# Patient Record
Sex: Male | Born: 1995 | Race: White | Hispanic: No | Marital: Married | Smoking: Current some day smoker
Health system: Southern US, Community
[De-identification: ages and names within clinical notes are randomized; demographics above are authoritative.]

## PROBLEM LIST (undated history)

## (undated) DIAGNOSIS — R079 Chest pain, unspecified: Secondary | ICD-10-CM

## (undated) DIAGNOSIS — R011 Cardiac murmur, unspecified: Secondary | ICD-10-CM

## (undated) DIAGNOSIS — I10 Essential (primary) hypertension: Secondary | ICD-10-CM

## (undated) HISTORY — DX: Essential (primary) hypertension: I10

## (undated) HISTORY — DX: Chest pain, unspecified: R07.9

---
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
## 2016-05-28 ENCOUNTER — Emergency Department (HOSPITAL_COMMUNITY)
Admission: EM | Admit: 2016-05-28 | Discharge: 2016-05-28 | Disposition: A | Discharge status: Home / Self Care | Payer: Medicaid Other | Attending: Emergency Medicine | Admitting: Emergency Medicine

## 2016-05-28 ENCOUNTER — Emergency Department (HOSPITAL_COMMUNITY): Payer: Medicaid Other

## 2016-05-28 ENCOUNTER — Encounter (HOSPITAL_COMMUNITY): Payer: Self-pay | Admitting: Emergency Medicine

## 2016-05-28 DIAGNOSIS — Y9389 Activity, other specified: Secondary | ICD-10-CM | POA: Diagnosis not present

## 2016-05-28 DIAGNOSIS — S60221A Contusion of right hand, initial encounter: Secondary | ICD-10-CM

## 2016-05-28 DIAGNOSIS — S60511A Abrasion of right hand, initial encounter: Secondary | ICD-10-CM

## 2016-05-28 DIAGNOSIS — Y929 Unspecified place or not applicable: Secondary | ICD-10-CM | POA: Insufficient documentation

## 2016-05-28 DIAGNOSIS — S6991XA Unspecified injury of right wrist, hand and finger(s), initial encounter: Secondary | ICD-10-CM | POA: Diagnosis present

## 2016-05-28 DIAGNOSIS — W228XXA Striking against or struck by other objects, initial encounter: Secondary | ICD-10-CM | POA: Insufficient documentation

## 2016-05-28 DIAGNOSIS — Y999 Unspecified external cause status: Secondary | ICD-10-CM | POA: Diagnosis not present
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
## 2016-05-28 NOTE — Discharge Instructions (Signed)
Your x-rays are negative for fracture or dislocation. Please apply ice for swelling. Please cleanse the wounds daily with soap and water, and apply fresh bandage until healed. Use Tylenol, and/or ibuprofen for soreness. Please see Dr. Donnie Coffinubin, or return to the emergency department if any changes, problems, or concerns.
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
## 2018-01-21 ENCOUNTER — Encounter (HOSPITAL_COMMUNITY): Payer: Self-pay | Admitting: Emergency Medicine

## 2018-01-21 ENCOUNTER — Emergency Department (HOSPITAL_COMMUNITY): Payer: BC Managed Care – PPO

## 2018-01-21 ENCOUNTER — Emergency Department (HOSPITAL_COMMUNITY)
Admission: EM | Admit: 2018-01-21 | Discharge: 2018-01-21 | Disposition: A | Discharge status: Home / Self Care | Payer: BC Managed Care – PPO | Attending: Emergency Medicine | Admitting: Emergency Medicine

## 2018-01-21 ENCOUNTER — Other Ambulatory Visit: Payer: Self-pay

## 2018-01-21 DIAGNOSIS — F17200 Nicotine dependence, unspecified, uncomplicated: Secondary | ICD-10-CM | POA: Diagnosis not present

## 2018-01-21 DIAGNOSIS — R079 Chest pain, unspecified: Secondary | ICD-10-CM

## 2018-01-21 HISTORY — DX: Cardiac murmur, unspecified: R01.1

## 2018-01-21 LAB — CBC
HEMATOCRIT: 48.2 % (ref 39.0–52.0)
HEMOGLOBIN: 17.2 g/dL — AB (ref 13.0–17.0)
MCH: 31.7 pg (ref 26.0–34.0)
MCHC: 35.7 g/dL (ref 30.0–36.0)
MCV: 88.8 fL (ref 78.0–100.0)
Platelets: 252 10*3/uL (ref 150–400)
RBC: 5.43 MIL/uL (ref 4.22–5.81)
RDW: 12.3 % (ref 11.5–15.5)
WBC: 8 10*3/uL (ref 4.0–10.5)

## 2018-01-21 LAB — BASIC METABOLIC PANEL
ANION GAP: 10 (ref 5–15)
BUN: 15 mg/dL (ref 6–20)
CALCIUM: 9.7 mg/dL (ref 8.9–10.3)
CHLORIDE: 105 mmol/L (ref 98–111)
CO2: 24 mmol/L (ref 22–32)
Creatinine, Ser: 0.87 mg/dL (ref 0.61–1.24)
GFR calc non Af Amer: >60 mL/min (ref >60)
GLUCOSE: 108 mg/dL — AB (ref 70–99)
Potassium: 3.7 mmol/L (ref 3.5–5.1)
Sodium: 139 mmol/L (ref 135–145)

## 2018-01-21 LAB — I-STAT TROPONIN, ED
TROPONIN I, POC: 0 ng/mL (ref 0.00–0.08)
Troponin i, poc: 0 ng/mL (ref 0.00–0.08)

## 2018-01-21 MED ORDER — ONDANSETRON HCL 4 MG/2ML IJ SOLN
4.0000 mg | Freq: Once | INTRAMUSCULAR | Status: AC
  Administered 2018-01-21: 4 mg via INTRAVENOUS
  Filled 2018-01-21: qty 2

## 2018-01-21 MED ORDER — SODIUM CHLORIDE 0.9 % IV BOLUS
1000.0000 mL | Freq: Once | INTRAVENOUS | Status: AC
  Administered 2018-01-21: 1000 mL via INTRAVENOUS

## 2018-01-21 NOTE — Discharge Instructions (Addendum)
If you develop recurrent or worsening chest pain, shortness of breath, vomiting, sweating, dizziness, or any other new ER for evaluation.  Otherwise follow closely with your primary care physician.

## 2018-01-21 NOTE — ED Notes (Signed)
Patient has no arm band.

## 2018-01-21 NOTE — ED Triage Notes (Signed)
Patient c/o intermittent chest pain x2 months. Per patient worse pain today then previous. Patient states dizziness. Only cardiac hx is heart murmur. Family hx of early MI.

## 2018-01-21 NOTE — ED Provider Notes (Signed)
[facility] EMERGENCY DEPARTMENT Provider Note   CSN: [id number] Arrival date & time: 01/21/18  1601     History   Chief Complaint Chief Complaint  Patient presents with  . Chest Pain    HPI Paul Lee is a 22 y.o. male.  HPI  22 year old male no significant past medical history who occasionally smokes presents with chest pain.  Started about 30 minutes prior to arrival.  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  Lasts about 10 minutes and resolved on its own.  He broke out in a sweat and felt like he had to breathe deeply although he was not dyspneic.  He did have some left hand numbness and dizziness.  Went away and then came back briefly while on the way here but then resolved again.  Currently feels a little nauseated but no other complaints.  Grandfather had a heart attack in his 55s he thinks.  Otherwise patient denies any significant past medical history.  The patient states he is been having on and off chest pressure similar to this over the last 2 months.  Occasionally will happen once a week or once every couple weeks, sometimes a couple times a week.  It is never produced by anything specific such as exertion and sometimes will happen when driving a car. No recent travel or surgeries. No leg swelling. Took 3 baby aspirin prior to arrival.  Past Medical History:  Diagnosis Date  . Heart murmur     There are no active problems to display for this patient.   History reviewed. No pertinent surgical history.      Home Medications    Prior to Admission medications   Medication Sig Start Date End Date Taking? Authorizing Provider  aspirin EC 81 MG tablet Take 81 mg by mouth once as needed (chest pain).   Yes [provider]  ibuprofen (ADVIL,MOTRIN) 200 MG tablet Take 400 mg by mouth every 6 (six) hours as needed for headache.   Yes [provider]    Family  History No family history on file.  Social History Social History   Tobacco Use  . Smoking status: Current Some Day Smoker  . Smokeless tobacco: Never Used  Substance Use Topics  . Alcohol use: No  . Drug use: No     Allergies   Patient has no known allergies.   Review of Systems Review of Systems  Constitutional: Positive for diaphoresis.  Respiratory: Negative for shortness of breath.   Cardiovascular: Positive for chest pain.  Gastrointestinal: Positive for nausea. Negative for vomiting.  Neurological: Positive for dizziness and numbness.  All other systems reviewed and are negative.    Physical Exam Updated Vital Signs BP (!) 143/86   Pulse (!) 57   Temp 98 F (36.7 C) (Oral)   Resp (!) 22   Ht 5\' 11"  (1.803 m)   Wt 117.9 kg   SpO2 99%   BMI 36.26 kg/m   Physical Exam  Constitutional: He is oriented to person, place, and time. He appears well-developed and well-nourished.  Non-toxic appearance. He does not appear ill.  HENT:  Head: Normocephalic and atraumatic.  Right Ear: External ear normal.  Left Ear: External ear normal.  Nose: Nose normal.  Eyes: Right eye exhibits no discharge. Left eye exhibits no discharge.  Neck: Neck supple.  Cardiovascular: Normal rate, regular rhythm and normal heart  sounds.  Pulses:      Radial pulses are 2+ on the right side, and 2+ on the left side.  Pulmonary/Chest: Effort normal and breath sounds normal.  Abdominal: Soft. There is no tenderness.  Musculoskeletal: He exhibits no edema.       Right lower leg: He exhibits no edema.       Left lower leg: He exhibits no edema.  Neurological: He is alert and oriented to person, place, and time.  Skin: Skin is warm and dry.  Nursing note and vitals reviewed.    ED Treatments / Results  Labs (all labs ordered are listed, but only abnormal results are displayed) Labs Reviewed  BASIC METABOLIC PANEL - Abnormal; Notable for the following components:      Result Value    Glucose, Bld 108 (*)    All other components within normal limits  CBC - Abnormal; Notable for the following components:   Hemoglobin 17.2 (*)    All other components within normal limits  I-STAT TROPONIN, ED  I-STAT TROPONIN, ED    EKG EKG Interpretation  Date/Time:  Sunday January 21 2018 16:09:10 EDT Ventricular Rate:  66 PR Interval:    QRS Duration: 84 QT Interval:  407 QTC Calculation: 427 R Axis:   72 Text Interpretation:  Sinus arrhythmia no acute ST/T changes No old tracing to compare Confirmed by Pricilla Loveless [phone]) on 01/21/2018 4:15:44 PM   EKG Interpretation  Date/Time:  Sunday January 21 2018 17:14:14 EDT Ventricular Rate:  56 PR Interval:    QRS Duration: 85 QT Interval:  451 QTC Calculation: 436 R Axis:   75 Text Interpretation:  Sinus rhythm ST elev, probable normal early repol pattern no significant change since earlier in the day Confirmed by Pricilla Loveless [phone]) on 01/21/2018 5:58:30 PM        Radiology Dg Chest 2 View  Result Date: 01/21/2018 CLINICAL DATA:  Chest pain and pressure.  Left arm tingling. EXAM: CHEST - 2 VIEW COMPARISON:  Single-view of the chest 06/30/2012. FINDINGS: The lungs are clear. Heart size is normal. No pneumothorax or pleural fluid. No acute or focal bony abnormality. IMPRESSION: Negative chest. Electronically Signed   By: Drusilla Kanner M.D.   On: 01/21/2018 17:19    Procedures Procedures (including critical care time)  Medications Ordered in ED Medications  sodium chloride 0.9 % bolus 1,000 mL (0 mLs Intravenous Stopped 01/21/18 1754)  ondansetron (ZOFRAN) injection 4 mg (4 mg Intravenous Given 01/21/18 1652)     Initial Impression / Assessment and Plan / ED Course  I have reviewed the triage vital signs and the nursing notes.  Pertinent labs & imaging results that were available during my care of the patient were reviewed by me and considered in my medical decision making (see chart for details).      Patient's chest pain is unlikely to be ACS given his age.  While he does occasionally smoke and has a family history of early coronary disease, this was not until the 3s and he has no other concerning findings related to possible ACS.  I doubt PE or dissection.  Unclear if it was the active working on the car that caused the symptoms but is been having random episodes of chest pain throughout the past weeks.  He was initially quite hypertensive although this may been anxiety related to being in the ED and feeling poorly because now his blood pressure is coming back down to a more normal range.  We have  discussed low concern for ACS, the 2 negative troponins, benign ECGs and importance of follow-up.  Strict return precautions.  Final Clinical Impressions(s) / ED Diagnoses   Final diagnoses:  Nonspecific chest pain    ED Discharge Orders    None       Pricilla Loveless, MD 01/21/18 2205

## 2018-02-01 ENCOUNTER — Ambulatory Visit: Payer: BC Managed Care – PPO | Admitting: Physician Assistant

## 2018-02-01 ENCOUNTER — Encounter: Payer: Self-pay | Admitting: Family Medicine

## 2018-02-01 ENCOUNTER — Other Ambulatory Visit: Payer: Self-pay

## 2018-02-01 ENCOUNTER — Ambulatory Visit (INDEPENDENT_AMBULATORY_CARE_PROVIDER_SITE_OTHER): Payer: BC Managed Care – PPO | Admitting: Family Medicine

## 2018-02-01 VITALS — BP 138/72 | HR 48 | Temp 98.5°F | Resp 14 | Ht 71.26 in | Wt 246.5 lb

## 2018-02-01 DIAGNOSIS — Z7689 Persons encountering health services in other specified circumstances: Secondary | ICD-10-CM

## 2018-02-01 DIAGNOSIS — R030 Elevated blood-pressure reading, without diagnosis of hypertension: Secondary | ICD-10-CM

## 2018-02-01 DIAGNOSIS — I160 Hypertensive urgency: Secondary | ICD-10-CM | POA: Diagnosis not present

## 2018-02-01 LAB — URINALYSIS, ROUTINE W REFLEX MICROSCOPIC
Bilirubin Urine: NEGATIVE
GLUCOSE, UA: NEGATIVE
HGB URINE DIPSTICK: NEGATIVE
Ketones, ur: NEGATIVE
LEUKOCYTES UA: NEGATIVE
NITRITE: NEGATIVE
PH: 7.5 (ref 5.0–8.0)
Protein, ur: NEGATIVE
SPECIFIC GRAVITY, URINE: 1.02 (ref 1.001–1.03)

## 2018-02-01 MED ORDER — LISINOPRIL 20 MG PO TABS: 20.0000 mg | ORAL_TABLET | Freq: Every day | ORAL | 3 refills | Status: DC

## 2018-02-01 MED ORDER — LISINOPRIL 10 MG PO TABS: 10.0000 mg | ORAL_TABLET | Freq: Every day | ORAL | 3 refills | Status: DC

## 2018-02-01 NOTE — Patient Instructions (Addendum)
If you have any chest pain   Preventing Hypertension Hypertension, commonly called high blood pressure, is when the force of blood pumping through the arteries is too strong. Arteries are blood vessels that carry blood from the heart throughout the body. Over time, hypertension can damage the arteries and decrease blood flow to important parts of the body, including the brain, heart, and kidneys. Often, hypertension does not cause symptoms until blood pressure is very high. For this reason, it is important to have your blood pressure checked on a regular basis. Hypertension can often be prevented with diet and lifestyle changes. If you already have hypertension, you can control it with diet and lifestyle changes, as well as medicine. What nutrition changes can be made? Maintain a healthy diet. This includes:  Eating less salt (sodium). Ask your health care provider how much sodium is safe for you to have. The general recommendation is to consume less than 1 tsp (2,300 mg) of sodium a day. ? Do not add salt to your food. ? Choose low-sodium options when grocery shopping and eating out.  Limiting fats in your diet. You can do this by eating low-fat or fat-free dairy products and by eating less red meat.  Eating more fruits, vegetables, and whole grains. Make a goal to eat: ? 1-2 cups of fresh fruits and vegetables each day. ? 3-4 servings of whole grains each day.  Avoiding foods and beverages that have added sugars.  Eating fish that contain healthy fats (omega-3 fatty acids), such as mackerel or salmon.  If you need help putting together a healthy eating plan, try the DASH diet. This diet is high in fruits, vegetables, and whole grains. It is low in sodium, red meat, and added sugars. DASH stands for Dietary Approaches to Stop Hypertension. What lifestyle changes can be made?  Lose weight if you are overweight. Losing just 3?5% of your body weight can help prevent or control  hypertension. ? For example, if your present weight is 200 lb (91 kg), a loss of 3-5% of your weight means losing 6-10 lb (2.7-4.5 kg). ? Ask your health care provider to help you with a diet and exercise plan to safely lose weight.  Get enough exercise. Do at least 150 minutes of moderate-intensity exercise each week. ? You could do this in short exercise sessions several times a day, or you could do longer exercise sessions a few times a week. For example, you could take a brisk 10-minute walk or bike ride, 3 times a day, for 5 days a week.  Find ways to reduce stress, such as exercising, meditating, listening to music, or taking a yoga class. If you need help reducing stress, ask your health care provider.  Do not smoke. This includes e-cigarettes. Chemicals in tobacco and nicotine products raise your blood pressure each time you smoke. If you need help quitting, ask your health care provider.  Avoid alcohol. If you drink alcohol, limit alcohol intake to no more than 1 drink a day for nonpregnant women and 2 drinks a day for men. One drink equals 12 oz of beer, 5 oz of wine, or 1 oz of hard liquor. Why are these changes important? Diet and lifestyle changes can help you prevent hypertension, and they may make you feel better overall and improve your quality of life. If you have hypertension, making these changes will help you control it and help prevent major complications, such as:  Hardening and narrowing of arteries that supply blood to: ?  Your heart. This can cause a heart attack. ? Your brain. This can cause a stroke. ? Your kidneys. This can cause kidney failure.  Stress on your heart muscle, which can cause heart failure.  What can I do to lower my risk?  Work with your health care provider to make a hypertension prevention plan that works for you. Follow your plan and keep all follow-up visits as told by your health care provider.  Learn how to check your blood pressure at home.  Make sure that you know your personal target blood pressure, as told by your health care provider. How is this treated? In addition to diet and lifestyle changes, your health care provider may recommend medicines to help lower your blood pressure. You may need to try a few different medicines to find what works best for you. You also may need to take more than one medicine. Take over-the-counter and prescription medicines only as told by your health care provider. Where to find support: Your health care provider can help you prevent hypertension and help you keep your blood pressure at a healthy level. Your local hospital or your community may also provide support services and prevention programs. The American Heart Association offers an online support network at: https://www.lee.net/ Where to find more information: Learn more about hypertension from:  National Heart, Lung, and Blood Institute: https://www.peterson.org/  Centers for Disease Control and Prevention: AboutHD.co.nz  American Academy of Family Physicians: http://familydoctor.org/familydoctor/en/diseases-conditions/high-blood-pressure.printerview.all.html  Learn more about the DASH diet from:  National Heart, Lung, and Blood Institute: WedMap.it  Contact a health care provider if:  You think you are having a reaction to medicines you have taken.  You have recurrent headaches or feel dizzy.  You have swelling in your ankles.  You have trouble with your vision. Summary  Hypertension often does not cause any symptoms until blood pressure is very high. It is important to get your blood pressure checked regularly.  Diet and lifestyle changes are the most important steps in preventing hypertension.  By keeping your blood pressure in a healthy range, you can prevent complications like heart attack, heart failure, stroke,  and kidney failure.  Work with your health care provider to make a hypertension prevention plan that works for you. This information is not intended to replace advice given to you by your health care provider. Make sure you discuss any questions you have with your health care provider. Document Released: 04/19/2015 Document Revised: 12/14/2015 Document Reviewed: 12/14/2015 Elsevier Interactive Patient Education  [employer].

## 2018-02-01 NOTE — Progress Notes (Signed)
Patient ID: Paul Lee, male    DOB: 1995-08-21, 22 y.o.   MRN: [id number]  PCP: Danelle Berry, PA-C  Chief Complaint  Patient presents with  . New Patient    is not fasting    Subjective:   Paul Lee is a 22 y.o. male, presents to clinic to establish care and to get a complete physical however he does have very elevated blood pressure today, and he recently went to the ER for chest pain and elevated BP.   Patient notes that for a few weeks he had intermittent chest pressure like someone was sitting on his chest, when he presented to the ER on 01/21/18 he was diaphoretic with chest pain, nausea, dizziness numbness blood pressure was elevated to SBP 170's.  Today's blood pressure was systolic 180-190.  He does not feel anxious, he denies CP, HA, vision disturbances.  He reports that he has had no headaches, shortness of breath or chest pain for the past 2 weeks after the ER visit.  He reports that they said that he was dehydrated and he admits to drinking roughly 7-9 sodas and hardly ever drinking water.  Blood work and ER documentation from 01/21/2018 was reviewed he had normal electrolytes, normal kidney function and negative troponin x2, hemoglobin was elevated, suspect they were suspicious for hemoconcentration secondary to dehydration, chest x-ray and EKG were unremarkable.  Patient's blood pressure came down on its own while in the ER without any medications to treat  He is a current smoker, trying to quit. Father and Mother have med hx of HTN.  No hx of early MI/stroke.  He has no current or past medical history except for heart murmur he believes when he was a small child he does not recall ever going to cardiology for this.  Last PCP was Dr. Donnie Coffin - pediatrician.    There are no active problems to display for this patient.    Prior to Admission medications   Medication Sig Start Date End Date Taking? Authorizing Provider  aspirin EC 81 MG tablet Take 81 mg by  mouth once as needed (chest pain).    [provider]  ibuprofen (ADVIL,MOTRIN) 200 MG tablet Take 400 mg by mouth every 6 (six) hours as needed for headache.    [provider]     No Known Allergies   Family History  Problem Relation Age of Onset  . Depression Mother   . Hypertension Mother   . Hypertension Father   . Hypertension Maternal Grandmother   . Hyperlipidemia Maternal Grandmother   . Alcohol abuse Maternal Grandfather   . Cancer Maternal Grandfather        skin  . Heart disease Maternal Grandfather        2003  . Hypertension Maternal Grandfather   . Hypertension Paternal Grandmother   . Cancer Paternal Grandfather        leukemia  . Hypertension Paternal Grandfather   . Stroke Paternal Grandfather      Social History   Socioeconomic History  . Marital status: Single    Spouse name: Not on file  . Number of children: Not on file  . Years of education: Not on file  . Highest education level: Associate degree: occupational, Scientist, product/process development, or vocational program  Occupational History  . Occupation: [employer]  Social Needs  . Financial resource strain: Not hard at all  . Food insecurity:    Worry: Never true    Inability: Never true  . Transportation  needs:    Medical: No    Non-medical: No  Tobacco Use  . Smoking status: Current Some Day Smoker  . Smokeless tobacco: Former Neurosurgeon  . Tobacco comment: in the process of quitting  Substance and Sexual Activity  . Alcohol use: Yes    Alcohol/week: 2.0 - 3.0 standard drinks    Types: 2 - 3 Cans of beer per week  . Drug use: No  . Sexual activity: Yes    Birth control/protection: Condom  Lifestyle  . Physical activity:    Days per week: 5 days    Minutes per session: 150+ min  . Stress: Not at all  Relationships  . Social connections:    Talks on phone: More than three times a week    Gets together: More than three times a week    Attends religious service: More than 4 times per year     Active member of club or organization: Yes    Attends meetings of clubs or organizations: More than 4 times per year    Relationship status: Living with partner  . Intimate partner violence:    Fear of current or ex partner: No    Emotionally abused: No    Physically abused: No    Forced sexual activity: No  Other Topics Concern  . Not on file  Social History Narrative  . Not on file     Review of Systems  Constitutional: Negative.  Negative for activity change, appetite change, fatigue and unexpected weight change.  HENT: Negative.   Eyes: Negative.   Respiratory: Negative.  Negative for shortness of breath.   Cardiovascular: Negative.  Negative for chest pain, palpitations and leg swelling.  Gastrointestinal: Negative.  Negative for abdominal pain and blood in stool.  Endocrine: Negative.   Genitourinary: Negative.  Negative for decreased urine volume, difficulty urinating, testicular pain and urgency.  Skin: Negative.  Negative for color change and pallor.  Allergic/Immunologic: Negative.   Neurological: Negative.  Negative for syncope, weakness, light-headedness and numbness.  Psychiatric/Behavioral: Negative.  Negative for confusion, dysphoric mood, self-injury and suicidal ideas. The patient is not nervous/anxious.   All other systems reviewed and are negative.      Objective:    Vitals:   02/01/18 1511 02/01/18 1515  BP: (!) 180/100 (!) 170/98  Pulse: 60   Resp: 14   Temp: 98.5 F (36.9 C)   TempSrc: Oral   SpO2: 97%   Weight: 246 lb 8 oz (111.8 kg)   Height: 5' 11.26" (1.81 m)       Physical Exam  Constitutional: He is oriented to person, place, and time. He appears well-developed and well-nourished.  Non-toxic appearance. He does not appear ill. No distress.  HENT:  Head: Normocephalic and atraumatic.  Right Ear: Tympanic membrane, external ear and ear canal normal.  Left Ear: Tympanic membrane, external ear and ear canal normal.  Nose: Nose normal. No  mucosal edema or rhinorrhea. Right sinus exhibits no maxillary sinus tenderness and no frontal sinus tenderness. Left sinus exhibits no maxillary sinus tenderness and no frontal sinus tenderness.  Mouth/Throat: Uvula is midline and oropharynx is clear and moist. No trismus in the jaw. No uvula swelling. No oropharyngeal exudate, posterior oropharyngeal edema or posterior oropharyngeal erythema.  Eyes: Pupils are equal, round, and reactive to light. Conjunctivae, EOM and lids are normal. No scleral icterus.  Neck: Trachea normal, normal range of motion and phonation normal. Neck supple. No JVD present. No tracheal deviation present.  Cardiovascular:  Regular rhythm, normal heart sounds, intact distal pulses and normal pulses.  No extrasystoles are present. Bradycardia present. PMI is not displaced. Exam reveals no gallop and no friction rub.  No murmur heard. Pulses:      Radial pulses are 2+ on the right side, and 2+ on the left side.       Dorsalis pedis pulses are 2+ on the right side, and 2+ on the left side.       Posterior tibial pulses are 2+ on the right side, and 2+ on the left side.  No LE edema  Pulmonary/Chest: Effort normal and breath sounds normal. No stridor. No respiratory distress. He has no wheezes. He has no rhonchi. He has no rales. He exhibits no tenderness.  Abdominal: Soft. Normal appearance and bowel sounds are normal. He exhibits no distension. There is no tenderness. There is no rebound and no guarding.  Musculoskeletal: Normal range of motion. He exhibits no edema.  Neurological: He is alert and oriented to person, place, and time. No cranial nerve deficit or sensory deficit. He exhibits normal muscle tone. Coordination and gait normal.  Skin: Skin is warm, dry and intact. Capillary refill takes less than 2 seconds. No rash noted. He is not diaphoretic.  Psychiatric: He has a normal mood and affect. His speech is normal and behavior is normal.  Nursing note and vitals  reviewed.   EKG:  Sinus bradycardia, HR 46, non-specific T wave abnormalities      Assessment & Plan:      ICD-10-CM   1. Elevated BP without diagnosis of hypertension R03.0   2. Hypertensive urgency I16.0 EKG 12-Lead    COMPLETE METABOLIC PANEL WITH GFR    CBC with Differential/Platelet    Lipid panel    Urinalysis, Routine w reflex microscopic    VAS US RENAL ARTERY DUPLEX    lisinopril (PRINIVIL,ZESTRIL) 10 MG tablet    DISCONTINUED: lisinopril (PRINIVIL,ZESTRIL) 20 MG tablet  3. Encounter to establish care with new doctor Z21.89     21 y/o male presents as new pt, is noted to have elevated BP upon arrival, also had recent ER visit for CP and HTN with neg EKG, CXR, troponins and normalization of BP while in the ED.   Patient here is asymptomatic, history of smoking but is trying to quit, mother and father have hypertension.  Obtain basic labs EKG to ensure no endorgan damage however highly suspect any. EKG shows sinus bradycardia without ischemic changes. Scanned through some pediatric visits and BP appears high for age -valuate with renal artery study   Here w/o intervention his blood pressure has improved and is no longer nearly as concerning- at 1637 BP 140/78, HR 56, SpO2 96%, at 1638 BP 138/72, HR 48, SpO2 98%   We will treat with lisinopril 10 mg, recheck in 1 to 2 weeks, she does work at [employer] he will monitor there  Patient is new to establish care will obtain records from pediatrician  Danelle Berry, PA-C 02/01/18 3:45 PM

## 2018-02-02 LAB — CBC WITH DIFFERENTIAL/PLATELET
Basophils Absolute: 50 cells/uL (ref 0–200)
Basophils Relative: 0.8 %
EOS PCT: 1.9 %
Eosinophils Absolute: 118 cells/uL (ref 15–500)
HEMATOCRIT: 48 % (ref 38.5–50.0)
Hemoglobin: 16.8 g/dL (ref 13.2–17.1)
Lymphs Abs: 1959 cells/uL (ref 850–3900)
MCH: 30.8 pg (ref 27.0–33.0)
MCHC: 35 g/dL (ref 32.0–36.0)
MCV: 88.1 fL (ref 80.0–100.0)
MPV: 11.5 fL (ref 7.5–12.5)
Monocytes Relative: 8.5 %
Neutro Abs: 3546 cells/uL (ref 1500–7800)
Neutrophils Relative %: 57.2 %
Platelets: 274 10*3/uL (ref 140–400)
RBC: 5.45 10*6/uL (ref 4.20–5.80)
RDW: 12.1 % (ref 11.0–15.0)
Total Lymphocyte: 31.6 %
WBC: 6.2 10*3/uL (ref 3.8–10.8)
WBCMIX: 527 {cells}/uL (ref 200–950)

## 2018-02-02 LAB — COMPLETE METABOLIC PANEL WITH GFR
AG Ratio: 2.1 (calc) (ref 1.0–2.5)
ALT: 23 U/L (ref 9–46)
AST: 14 U/L (ref 10–40)
Albumin: 5.2 g/dL — ABNORMAL HIGH (ref 3.6–5.1)
Alkaline phosphatase (APISO): 41 U/L (ref 40–115)
BUN: 14 mg/dL (ref 7–25)
CO2: 29 mmol/L (ref 20–32)
CREATININE: 0.91 mg/dL (ref 0.60–1.35)
Calcium: 10.2 mg/dL (ref 8.6–10.3)
Chloride: 104 mmol/L (ref 98–110)
GFR, EST NON AFRICAN AMERICAN: 121 mL/min/{1.73_m2} (ref >=60)
GFR, Est African American: 140 mL/min/{1.73_m2} (ref >=60)
Globulin: 2.5 g/dL (calc) (ref 1.9–3.7)
Glucose, Bld: 94 mg/dL (ref 65–99)
POTASSIUM: 4 mmol/L (ref 3.5–5.3)
Sodium: 140 mmol/L (ref 135–146)
TOTAL PROTEIN: 7.7 g/dL (ref 6.1–8.1)
Total Bilirubin: 0.6 mg/dL (ref 0.2–1.2)

## 2018-02-02 LAB — LIPID PANEL
Cholesterol: 122 mg/dL (ref <200)
HDL: 30 mg/dL — AB (ref >40)
LDL CHOLESTEROL (CALC): 70 mg/dL
NON-HDL CHOLESTEROL (CALC): 92 mg/dL (ref <130)
TRIGLYCERIDES: 134 mg/dL (ref <150)
Total CHOL/HDL Ratio: 4.1 (calc) (ref <5.0)

## 2018-02-08 ENCOUNTER — Encounter: Payer: Self-pay | Admitting: Family Medicine

## 2018-02-08 ENCOUNTER — Ambulatory Visit: Payer: BC Managed Care – PPO | Admitting: Family Medicine

## 2018-02-08 ENCOUNTER — Other Ambulatory Visit: Payer: Self-pay

## 2018-02-08 VITALS — BP 134/86 | HR 56 | Temp 99.6°F | Resp 16 | Ht 71.25 in | Wt 240.0 lb

## 2018-02-08 DIAGNOSIS — J029 Acute pharyngitis, unspecified: Secondary | ICD-10-CM | POA: Diagnosis not present

## 2018-02-08 DIAGNOSIS — K529 Noninfective gastroenteritis and colitis, unspecified: Secondary | ICD-10-CM

## 2018-02-08 DIAGNOSIS — I10 Essential (primary) hypertension: Secondary | ICD-10-CM | POA: Diagnosis not present

## 2018-02-08 LAB — INFLUENZA A AND B AG, IMMUNOASSAY
INFLUENZA A ANTIGEN: NOT DETECTED
INFLUENZA B ANTIGEN: NOT DETECTED

## 2018-02-08 MED ORDER — ONDANSETRON HCL 4 MG PO TABS: 4.0000 mg | ORAL_TABLET | Freq: Three times a day (TID) | ORAL | 0 refills | Status: DC | PRN

## 2018-02-08 NOTE — Progress Notes (Signed)
Patient ID: Paul Lee, male    DOB: 01-Sep-1995, 22 y.o.   MRN: [id number]  PCP: Danelle Berry, PA-C  Chief Complaint  Patient presents with  . Illness    x3 days- HA, N/V, fever (T max 102.8), abd cramping, diarrhea, body aches, joint pain    Subjective:   Paul Lee is a 22 y.o. male, presents to clinic with CC of follow-up for elevated blood pressure, he did start taking lisinopril 10 mg and is here for recheck.  He did not notice any side effects after starting blood pressure medication.  He denies any headaches, eye pain, chest pain, shortness of breath, lower extremity edema after starting lisinopril.  He has stopped smoking and stop drinking caffeinated beverages and attempts to get more healthy and help improve his blood pressure.  He has been checking it at the fire station and not had any elevated readings above 140/90.    He fortunately became ill 3 to 4 days ago with body aches, fever, T-max of 102.8, with abdominal cramping, vomiting and diarrhea, stated headache, hot cold chills and sweats.  Last fever was yesterday morning, and last vomiting was also yesterday he has been tolerating liquids and solids today and feels much better.  He has no abdominal pain, but does have some chills and body aches still.  Associated headache when ill and immediately after he stopped drinking caffeine however no current headache while in exam room.  Family History  Problem Relation Age of Onset  . Depression Mother   . Hypertension Mother   . Hypertension Father   . Hypertension Maternal Grandmother   . Hyperlipidemia Maternal Grandmother   . Alcohol abuse Maternal Grandfather   . Cancer Maternal Grandfather        skin  . Heart disease Maternal Grandfather        2003  . Hypertension Maternal Grandfather   . Hypertension Paternal Grandmother   . Cancer Paternal Grandfather        leukemia  . Hypertension Paternal Grandfather   . Stroke Paternal Grandfather       Social History   Socioeconomic History  . Marital status: Single    Spouse name: Not on file  . Number of children: Not on file  . Years of education: Not on file  . Highest education level: Associate degree: occupational, Scientist, product/process development, or vocational program  Occupational History  . Occupation: [employer]  Social Needs  . Financial resource strain: Not hard at all  . Food insecurity:    Worry: Never true    Inability: Never true  . Transportation needs:    Medical: No    Non-medical: No  Tobacco Use  . Smoking status: Current Some Day Smoker  . Smokeless tobacco: Former Neurosurgeon  . Tobacco comment: in the process of quitting  Substance and Sexual Activity  . Alcohol use: Yes    Alcohol/week: 2.0 - 3.0 standard drinks    Types: 2 - 3 Cans of beer per week  . Drug use: No  . Sexual activity: Yes    Birth control/protection: Condom  Lifestyle  . Physical activity:    Days per week: 5 days    Minutes per session: 150+ min  . Stress: Not at all  Relationships  . Social connections:    Talks on phone: More than three times a week    Gets together: More than three times a week    Attends religious service: More than 4 times per year  Active member of club or organization: Yes    Attends meetings of clubs or organizations: More than 4 times per year    Relationship status: Living with partner  . Intimate partner violence:    Fear of current or ex partner: No    Emotionally abused: No    Physically abused: No    Forced sexual activity: No  Other Topics Concern  . Not on file  Social History Narrative  . Not on file     Review of Systems  Constitutional: Negative.   HENT: Negative.   Eyes: Negative.   Respiratory: Negative.   Cardiovascular: Negative.   Gastrointestinal: Negative.   Endocrine: Negative.   Genitourinary: Negative.   Musculoskeletal: Negative.   Skin: Negative.   Allergic/Immunologic: Negative.   Neurological: Negative.   Hematological: Negative.    Psychiatric/Behavioral: Negative.   All other systems reviewed and are negative.      Objective:    Vitals:   02/08/18 1011  BP: 134/86  Pulse: (!) 56  Resp: 16  Temp: 99.6 F (37.6 C)  TempSrc: Oral  SpO2: 97%  Weight: 240 lb (108.9 kg)  Height: 5' 11.25" (1.81 m)      There are no active problems to display for this patient.    Prior to Admission medications   Medication Sig Start Date End Date Taking? Authorizing Provider  aspirin EC 81 MG tablet Take 81 mg by mouth once as needed (chest pain).   Yes [provider]  ibuprofen (ADVIL,MOTRIN) 200 MG tablet Take 400 mg by mouth every 6 (six) hours as needed for headache.   Yes [provider]  lisinopril (PRINIVIL,ZESTRIL) 10 MG tablet Take 1 tablet (10 mg total) by mouth daily. 02/01/18  Yes Danelle Berry, PA-C  ondansetron (ZOFRAN) 4 MG tablet Take 1 tablet (4 mg total) by mouth every 8 (eight) hours as needed for nausea or vomiting. 02/08/18   Danelle Berry, PA-C     No Known Allergies    Physical Exam  Constitutional: He is oriented to person, place, and time. He appears well-developed and well-nourished.  Non-toxic appearance. He does not appear ill. No distress.  Appearing young male, mildly diaphoretic  HENT:  Head: Normocephalic and atraumatic.  Right Ear: Tympanic membrane, external ear and ear canal normal.  Left Ear: Tympanic membrane, external ear and ear canal normal.  Nose: Nose normal. No mucosal edema or rhinorrhea. Right sinus exhibits no maxillary sinus tenderness and no frontal sinus tenderness. Left sinus exhibits no maxillary sinus tenderness and no frontal sinus tenderness.  Mouth/Throat: Uvula is midline. No trismus in the jaw. No uvula swelling. No oropharyngeal exudate, posterior oropharyngeal edema or posterior oropharyngeal erythema.  Posterior oropharyngeal erythema no exudate, uvula midline, no edema  Eyes: Pupils are equal, round, and reactive to light. Conjunctivae,  EOM and lids are normal. No scleral icterus.  Neck: Trachea normal, normal range of motion and phonation normal. Neck supple. No tracheal deviation present.  Cardiovascular: Normal rate, regular rhythm, normal heart sounds, intact distal pulses and normal pulses. Exam reveals no gallop and no friction rub.  No murmur heard. Pulses:      Radial pulses are 2+ on the right side, and 2+ on the left side.       Posterior tibial pulses are 2+ on the right side, and 2+ on the left side.  Pulmonary/Chest: Effort normal and breath sounds normal. No stridor. No respiratory distress. He has no wheezes. He has no rhonchi. He has no  rales.  Abdominal: Soft. Normal appearance and bowel sounds are normal. He exhibits no distension and no mass. There is no tenderness. There is no rebound and no guarding.  Musculoskeletal: Normal range of motion. He exhibits no edema.  Neurological: He is alert and oriented to person, place, and time. Gait normal.  Skin: Skin is warm, dry and intact. Capillary refill takes less than 2 seconds. No rash noted. No pallor.  Psychiatric: He has a normal mood and affect. His speech is normal and behavior is normal.  Nursing note and vitals reviewed.    Flu negative     Assessment & Plan:      ICD-10-CM   1. Gastroenteritis K52.9 Influenza A and B Ag, Immunoassay    ondansetron (ZOFRAN) 4 MG tablet  2. Pharyngitis, unspecified etiology J02.9 Influenza A and B Ag, Immunoassay  3. Essential hypertension I10     Patient is a 22 year old male was recently established here and had very high blood pressures while in clinic, he was started on blood pressure medication and was going to recheck.  Fortunately he became very ill a few days ago but has gradually improved.  Suspect he has viral gastroenteritis and viral pharyngitis that both seem to be improving, flu was negative, tolerating PO's, last fever yesterday.  Continue to push fluids, over-the-counter medicines for fever and body  aches, Zofran for nausea or vomiting as needed.  Will up with any recurrence or worsening follow-up if any severe abdominal pain but currently he has none and abdominal exam is benign.  Blood pressure is improved today at 134/86, no side effects from starting lisinopril, he has made very ambitious lifestyle changes with stopping smoking, decreasing caffeine.  Encouraged him to continue these healthy changes, continue monitoring his blood pressure, follow-up in 3 to 4 months routine follow-up of hypertension, and to please come sooner if he is having any high or low blood pressure readings, symptoms or side effects of medication.   Danelle Berry, PA-C 02/08/18 10:32 AM

## 2018-02-12 ENCOUNTER — Encounter: Payer: Self-pay | Admitting: Family Medicine

## 2018-02-15 ENCOUNTER — Ambulatory Visit: Payer: BC Managed Care – PPO | Admitting: Family Medicine

## 2018-02-19 ENCOUNTER — Encounter (HOSPITAL_COMMUNITY): Payer: BC Managed Care – PPO

## 2018-02-20 ENCOUNTER — Encounter: Payer: Self-pay | Admitting: Family Medicine

## 2018-11-30 ENCOUNTER — Telehealth: Payer: Self-pay | Admitting: Family Medicine

## 2018-11-30 NOTE — Telephone Encounter (Signed)
[id number] Patient is calling to say that he is taking lisinopril  Seems like every afternoon he is getting headache  Could this be coming from the lisinopril  Former leisa patient wanted to switch to dr pickard

## 2018-11-30 NOTE — Telephone Encounter (Signed)
Call him back and schedule him an apt - thanks

## 2019-01-01 ENCOUNTER — Other Ambulatory Visit: Payer: Self-pay | Admitting: Family Medicine

## 2019-01-01 DIAGNOSIS — I160 Hypertensive urgency: Secondary | ICD-10-CM

## 2019-01-01 MED ORDER — LISINOPRIL 10 MG PO TABS: 10.0000 mg | ORAL_TABLET | Freq: Every day | ORAL | 0 refills | Status: DC

## 2019-01-04 ENCOUNTER — Other Ambulatory Visit: Payer: Self-pay

## 2019-01-07 ENCOUNTER — Ambulatory Visit (INDEPENDENT_AMBULATORY_CARE_PROVIDER_SITE_OTHER): Payer: BC Managed Care – PPO | Admitting: Family Medicine

## 2019-01-07 ENCOUNTER — Encounter: Payer: Self-pay | Admitting: Family Medicine

## 2019-01-07 ENCOUNTER — Other Ambulatory Visit: Payer: Self-pay

## 2019-01-07 VITALS — BP 132/74 | HR 80 | Temp 98.8°F | Resp 16 | Ht 71.75 in | Wt 233.0 lb

## 2019-01-07 DIAGNOSIS — I10 Essential (primary) hypertension: Secondary | ICD-10-CM | POA: Diagnosis not present

## 2019-01-07 LAB — LIPID PANEL
Cholesterol: 109 mg/dL (ref <200)
HDL: 35 mg/dL — ABNORMAL LOW (ref >=40)
LDL Cholesterol (Calc): 59 mg/dL (calc)
Non-HDL Cholesterol (Calc): 74 mg/dL (calc) (ref <130)
Total CHOL/HDL Ratio: 3.1 (calc) (ref <5.0)
Triglycerides: 70 mg/dL (ref <150)

## 2019-01-07 LAB — COMPLETE METABOLIC PANEL WITH GFR
AG Ratio: 2 (calc) (ref 1.0–2.5)
ALT: 16 U/L (ref 9–46)
AST: 12 U/L (ref 10–40)
Albumin: 5 g/dL (ref 3.6–5.1)
Alkaline phosphatase (APISO): 39 U/L (ref 36–130)
BUN: 17 mg/dL (ref 7–25)
CO2: 28 mmol/L (ref 20–32)
Calcium: 10.3 mg/dL (ref 8.6–10.3)
Chloride: 106 mmol/L (ref 98–110)
Creat: 1.02 mg/dL (ref 0.60–1.35)
GFR, Est African American: 121 mL/min/{1.73_m2} (ref >=60)
GFR, Est Non African American: 105 mL/min/{1.73_m2} (ref >=60)
Globulin: 2.5 g/dL (calc) (ref 1.9–3.7)
Glucose, Bld: 102 mg/dL — ABNORMAL HIGH (ref 65–99)
Potassium: 4.1 mmol/L (ref 3.5–5.3)
Sodium: 142 mmol/L (ref 135–146)
Total Bilirubin: 0.5 mg/dL (ref 0.2–1.2)
Total Protein: 7.5 g/dL (ref 6.1–8.1)

## 2019-01-07 LAB — CBC WITH DIFFERENTIAL/PLATELET
Absolute Monocytes: 564 cells/uL (ref 200–950)
Basophils Absolute: 41 cells/uL (ref 0–200)
Basophils Relative: 0.6 %
Eosinophils Absolute: 122 cells/uL (ref 15–500)
Eosinophils Relative: 1.8 %
HCT: 46.5 % (ref 38.5–50.0)
Hemoglobin: 16 g/dL (ref 13.2–17.1)
Lymphs Abs: 2047 cells/uL (ref 850–3900)
MCH: 30.4 pg (ref 27.0–33.0)
MCHC: 34.4 g/dL (ref 32.0–36.0)
MCV: 88.2 fL (ref 80.0–100.0)
MPV: 11.3 fL (ref 7.5–12.5)
Monocytes Relative: 8.3 %
Neutro Abs: 4026 cells/uL (ref 1500–7800)
Neutrophils Relative %: 59.2 %
Platelets: 246 10*3/uL (ref 140–400)
RBC: 5.27 10*6/uL (ref 4.20–5.80)
RDW: 12 % (ref 11.0–15.0)
Total Lymphocyte: 30.1 %
WBC: 6.8 10*3/uL (ref 3.8–10.8)

## 2019-01-07 NOTE — Progress Notes (Signed)
Subjective:    Patient ID: Paul Lee, male    DOB: December 16, 1995, 23 y.o.   MRN: [id number]  HPI Patient is a very pleasant 23 year old Caucasian male here today for a follow-up of his blood pressure.  Patient states that in the past his systolic blood pressure would be as high as 200.  Approximately year ago he was started on lisinopril 10 mg a day.  This medicine has worked excellent.  His systolic blood pressures consistently between 120 and 130.  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  Here today is well controlled.  He denies any side effects from the medication.  Specifically he denies any cough, chest pain, angioedema, shortness of breath.  Overall he is doing well with no concerns.  Recently he lifted a wall behind concrete saw and felt a pop in his back.  After that he was experiencing pain in his left lower flank.  He would also have some numbness and tingling "like his legs were falling asleep" radiating down both legs at times.  However this improved after a few days of taking ibuprofen.  Now he has some mild tenderness in his left flank but outside of that his pain is much improved.  He has a negative straight leg raise today.  He has normal reflexes checked at the patella and at the Achilles.  He has 5/5 muscle strength equal and symmetric in both legs. Past Medical History:  Diagnosis Date  . Chest pain   . Heart murmur    No past surgical history on file. Current Outpatient Medications on File Prior to Visit  Medication Sig Dispense Refill  . aspirin EC 81 MG tablet Take 81 mg by mouth once as needed (chest pain).    Marland Kitchen lisinopril (ZESTRIL) 10 MG tablet Take 1 tablet (10 mg total) by mouth daily. 30 tablet 0   No current facility-administered medications on file prior to visit.    No Known Allergies Social History   Socioeconomic History  . Marital status: Single    Spouse name: Not on file  . Number of children: Not on file  .  Years of education: Not on file  . Highest education level: Associate degree: occupational, Scientist, product/process development, or vocational program  Occupational History  . Occupation: [employer]  Social Needs  . Financial resource strain: Not hard at all  . Food insecurity    Worry: Never true    Inability: Never true  . Transportation needs    Medical: No    Non-medical: No  Tobacco Use  . Smoking status: Current Some Day Smoker  . Smokeless tobacco: Former Neurosurgeon  . Tobacco comment: in the process of quitting  Substance and Sexual Activity  . Alcohol use: Yes    Alcohol/week: 2.0 - 3.0 standard drinks    Types: 2 - 3 Cans of beer per week  . Drug use: No  . Sexual activity: Yes    Birth control/protection: Condom  Lifestyle  . Physical activity    Days per week: 5 days    Minutes per session: 150+ min  . Stress: Not at all  Relationships  . Social connections    Talks on phone: More than three times a week    Gets together: More than three times a week    Attends religious service: More than 4 times per year    Active member of club or organization: Yes    Attends meetings of clubs or organizations: More  than 4 times per year    Relationship status: Living with partner  . Intimate partner violence    Fear of current or ex partner: No    Emotionally abused: No    Physically abused: No    Forced sexual activity: No  Other Topics Concern  . Not on file  Social History Narrative  . Not on file     Review of Systems     Objective:   Physical Exam Vitals signs reviewed.  Constitutional:      Appearance: Normal appearance. He is normal weight.  Cardiovascular:     Rate and Rhythm: Normal rate and regular rhythm.     Heart sounds: Normal heart sounds. No murmur.  Pulmonary:     Effort: No respiratory distress.     Breath sounds: Normal breath sounds. No stridor. No wheezing, rhonchi or rales.  Abdominal:     General: Abdomen is flat. Bowel sounds are normal.     Palpations: Abdomen is  soft.  Musculoskeletal:     Lumbar back: He exhibits pain. He exhibits normal range of motion, no tenderness, no bony tenderness and no spasm.       Back:     Right lower leg: No edema.     Left lower leg: No edema.  Neurological:     Mental Status: He is alert.           Assessment & Plan:  Essential hypertension - Plan: CBC with Differential/Platelet, COMPLETE METABOLIC PANEL WITH GFR, Lipid panel  Blood pressure today is outstanding.  I will check a CBC, CMP, fasting lipid panel.  Assuming his lab work is good I would make no changes in his lisinopril as it seems to be working well.  I believe the patient likely strained a muscle in his lower back and possibly could have had a slight disc herniation given his neuropathic symptoms.  However he is now essentially asymptomatic and therefore no further treatment is necessary as long as his emptiness continue to remain resolved.

## 2019-01-30 ENCOUNTER — Other Ambulatory Visit: Payer: Self-pay | Admitting: Family Medicine

## 2019-01-30 DIAGNOSIS — I160 Hypertensive urgency: Secondary | ICD-10-CM

## 2019-01-30 MED ORDER — LISINOPRIL 10 MG PO TABS: 10.0000 mg | ORAL_TABLET | Freq: Every day | ORAL | 0 refills | Status: DC

## 2019-02-12 IMAGING — DX DG CHEST 2V
2 series · 2 of 2 positions shown · non-contrast
Comparison: Single-view of the chest 06/30/2012.

CLINICAL DATA: Chest pain and pressure.  Left arm tingling.

EXAM:
CHEST - 2 VIEW

[chest lat]
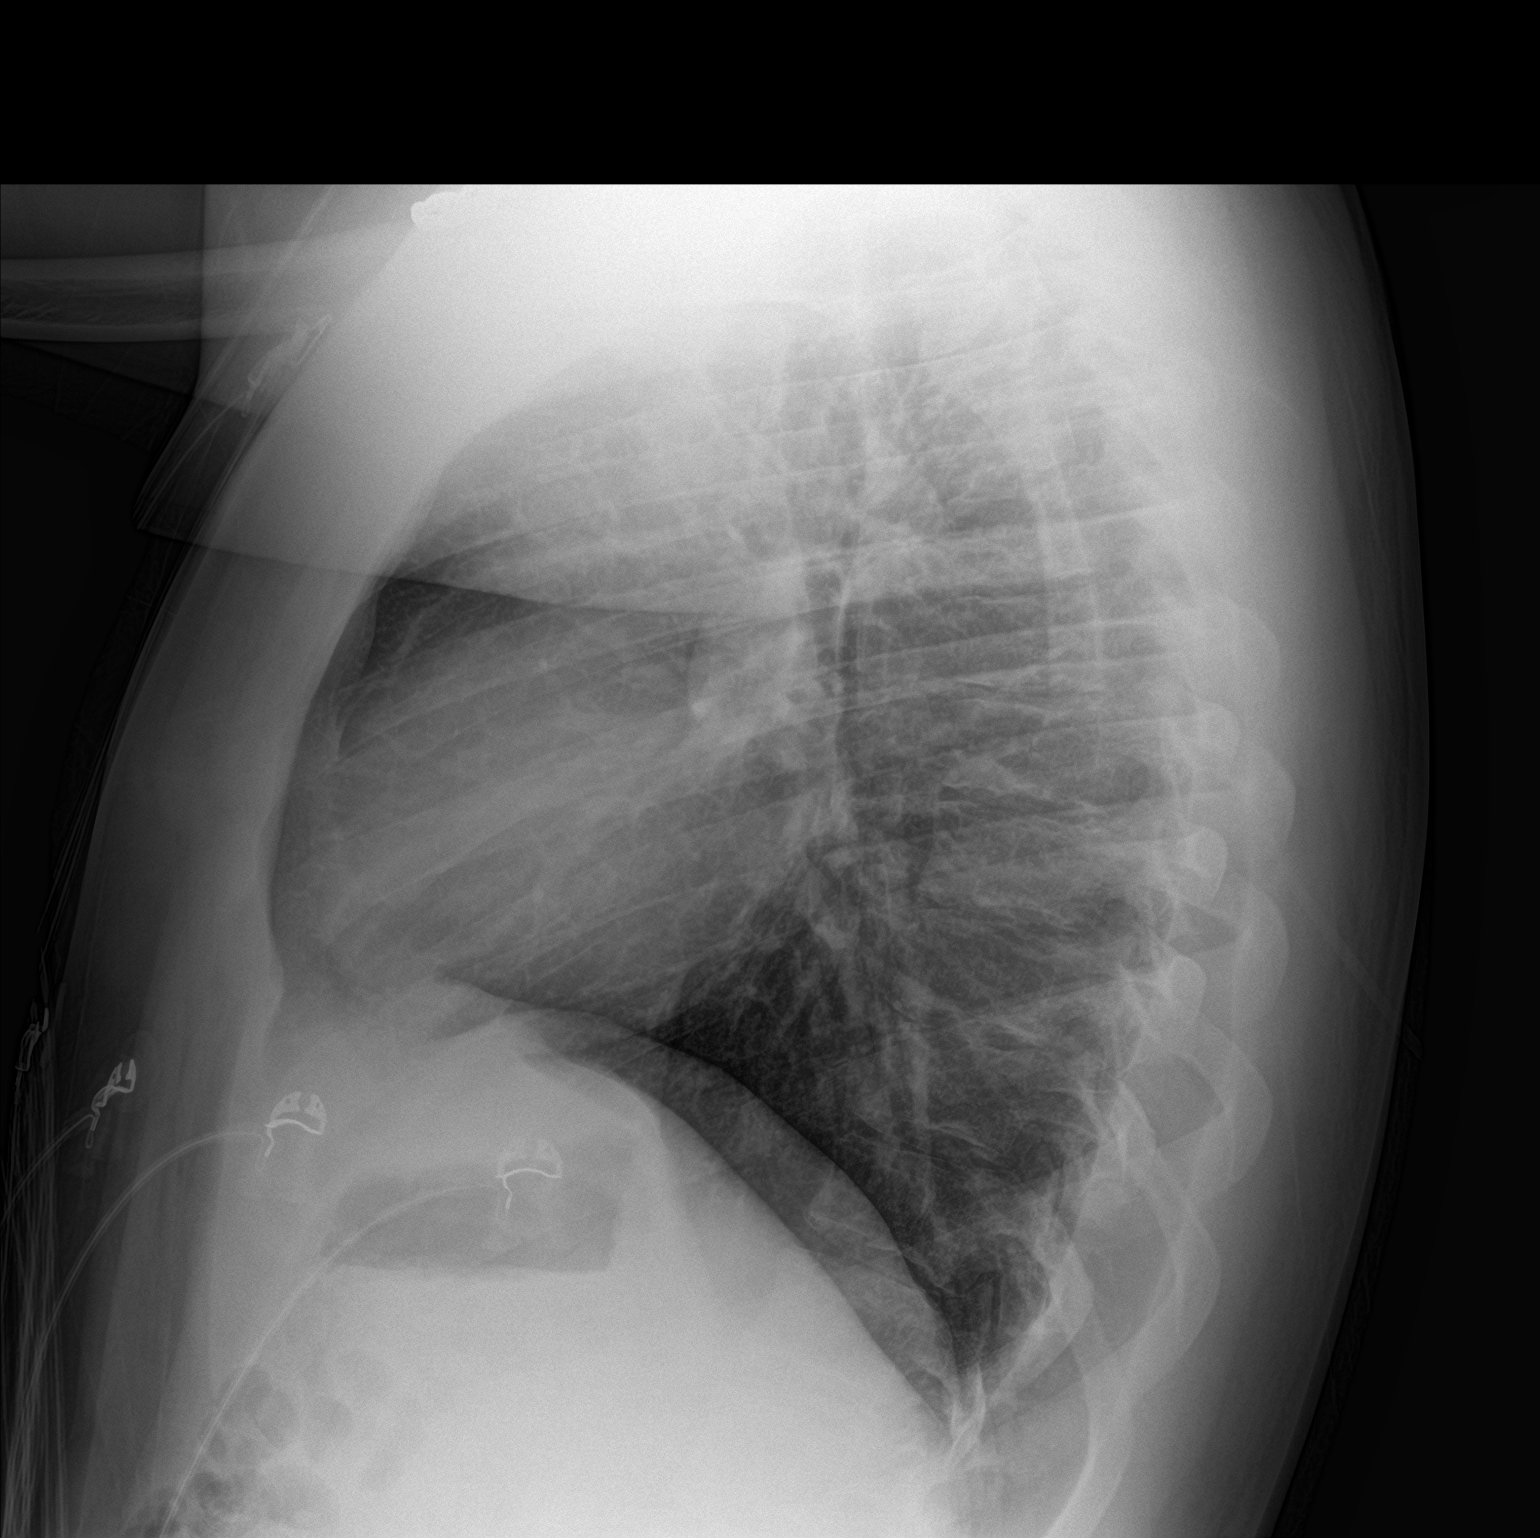

[chest pa]
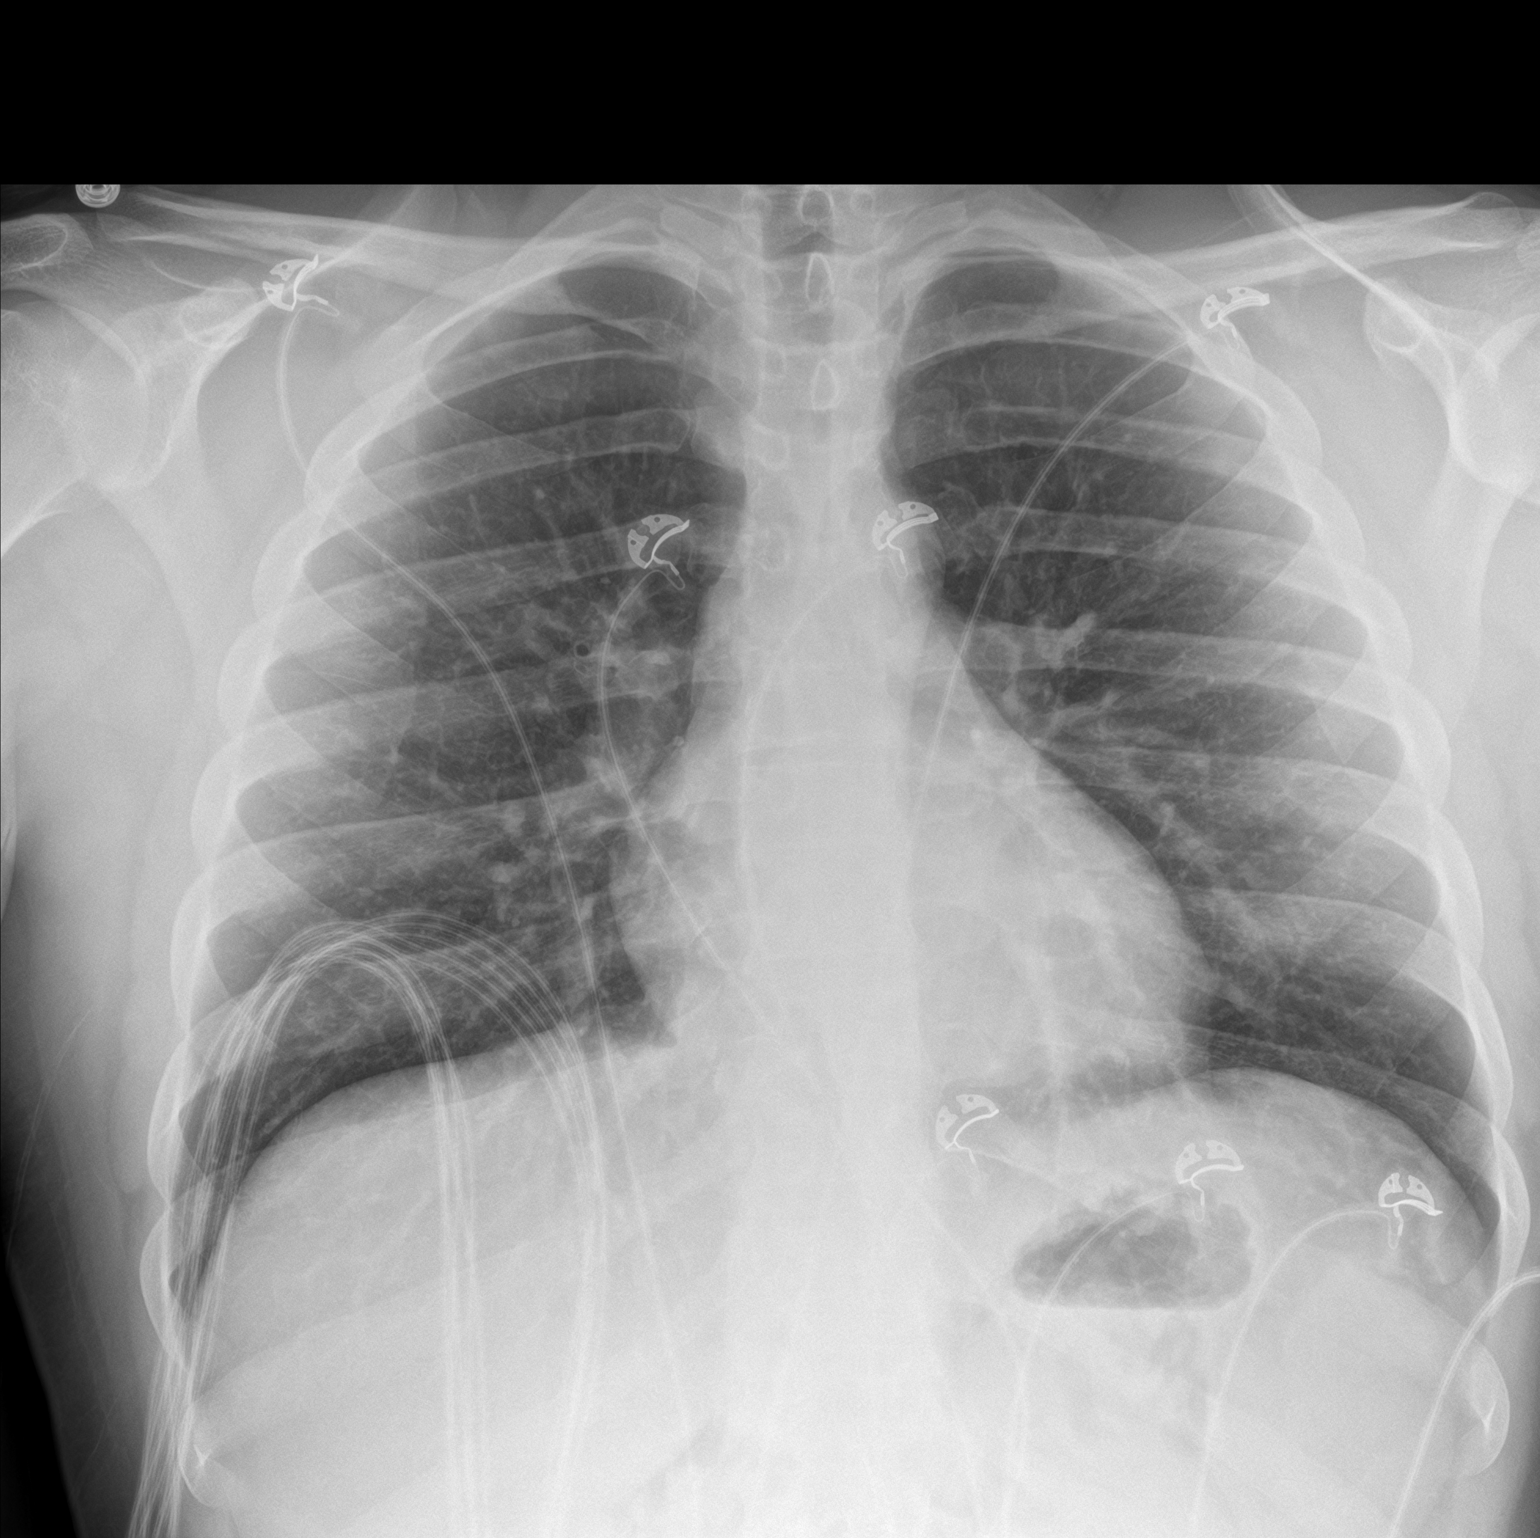

[2 of 2 positions shown; findings below may reference images not displayed]

FINDINGS: The lungs are clear. Heart size is normal. No pneumothorax or
pleural fluid. No acute or focal bony abnormality.
IMPRESSION: Negative chest.

## 2019-03-19 ENCOUNTER — Other Ambulatory Visit: Payer: Self-pay

## 2019-03-19 DIAGNOSIS — Z20822 Contact with and (suspected) exposure to covid-19: Secondary | ICD-10-CM

## 2019-03-21 LAB — NOVEL CORONAVIRUS, NAA: SARS-CoV-2, NAA: NOT DETECTED

## 2019-03-29 ENCOUNTER — Ambulatory Visit (INDEPENDENT_AMBULATORY_CARE_PROVIDER_SITE_OTHER): Payer: BC Managed Care – PPO | Admitting: Family Medicine

## 2019-03-29 ENCOUNTER — Other Ambulatory Visit: Payer: Self-pay

## 2019-03-29 DIAGNOSIS — Z20822 Contact with and (suspected) exposure to covid-19: Secondary | ICD-10-CM

## 2019-03-29 DIAGNOSIS — J019 Acute sinusitis, unspecified: Secondary | ICD-10-CM

## 2019-03-29 MED ORDER — AMOXICILLIN 875 MG PO TABS: 875.0000 mg | ORAL_TABLET | Freq: Two times a day (BID) | ORAL | 0 refills | Status: DC

## 2019-03-29 NOTE — Progress Notes (Signed)
Subjective:    Patient ID: Paul Lee, male    DOB: 15-Aug-1995, 23 y.o.   MRN: [id number]  HPI Patient is being seen today as a telephone visit.  He consents to be seen via telephone.  Phone call began at 950.  Phone call concluded at 10:00.  Patient states that his symptoms began Saturday 1 week ago.  Symptoms include a sore throat, head congestion, rhinorrhea, postnasal drip, and a nonproductive cough.  He states that he is not having any shortness of breath or chest pain.  He denies any loss of his sense of taste or smell.  However his symptoms are worsening and now he has developed a more severe headache located behind his eyes.  He describes it as a constant head pressure.  He has tried over-the-counter cold medication without any benefit.  His roommate had similar symptoms last week.  Both he and his roommate were tested for Covid on Friday of last week and tested negative.  His roommate is now doing better.  However his symptoms seem to be worsening.  He denies any shortness of breath or pleurisy. Past Medical History:  Diagnosis Date  . Chest pain   . Heart murmur    No past surgical history on file. Current Outpatient Medications on File Prior to Visit  Medication Sig Dispense Refill  . aspirin EC 81 MG tablet Take 81 mg by mouth once as needed (chest pain).    Marland Kitchen lisinopril (ZESTRIL) 10 MG tablet Take 1 tablet by mouth once daily 30 tablet 5   No current facility-administered medications on file prior to visit.   No Known Allergies Social History   Socioeconomic History  . Marital status: Single    Spouse name: Not on file  . Number of children: Not on file  . Years of education: Not on file  . Highest education level: Associate degree: occupational, Hotel manager, or vocational program  Occupational History  . Occupation: [employer]  Tobacco Use  . Smoking status: Current Some Day Smoker  . Smokeless tobacco: Former Systems developer  . Tobacco comment: in the process of quitting    Substance and Sexual Activity  . Alcohol use: Yes    Alcohol/week: 2.0 - 3.0 standard drinks    Types: 2 - 3 Cans of beer per week  . Drug use: No  . Sexual activity: Yes    Birth control/protection: Condom  Other Topics Concern  . Not on file  Social History Narrative  . Not on file   Social Determinants of Health   Financial Resource Strain: Low Risk   . Difficulty of Paying Living Expenses: Not hard at all  Food Insecurity: No Food Insecurity  . Worried About Charity fundraiser in the Last Year: Never true  . Ran Out of Food in the Last Year: Never true  Transportation Needs: No Transportation Needs  . Lack of Transportation (Medical): No  . Lack of Transportation (Non-Medical): No  Physical Activity: Sufficiently Active  . Days of Exercise per Week: 5 days  . Minutes of Exercise per Session: 150+ min  Stress: No Stress Concern Present  . Feeling of Stress : Not at all  Social Connections: Not Isolated  . Frequency of Communication with Friends and Family: More than three times a week  . Frequency of Social Gatherings with Friends and Family: More than three times a week  . Attends Religious Services: More than 4 times per year  . Active Member of Clubs or Organizations: Yes  .  Attends Banker Meetings: More than 4 times per year  . Marital Status: Living with partner  Intimate Partner Violence: Not At Risk  . Fear of Current or Ex-Partner: No  . Emotionally Abused: No  . Physically Abused: No  . Sexually Abused: No      Review of Systems  All other systems reviewed and are negative.      Objective:   Physical Exam  Physical exam cannot be performed today as patient was seen as a telephone visit however he is speaking full and complete sentences without any respiratory distress      Assessment & Plan:  Acute rhinosinusitis  Symptoms sound consistent for viral upper respiratory infection most likely he had what his roommate had.  However his  symptoms are now worsening 7 days then.  He has developed pain and pressure in his sinuses and sounds like he may have developed a secondary sinus infection.  I will treat the patient with amoxicillin 875 mg twice daily for 10 days.  I recommended over-the-counter decongestant such as Sudafed.  I have recommended that he quarantine until feeling better and if his symptoms continue to worsen I would recommend getting tested for Covid again.  Patient is in agreement.

## 2019-03-30 LAB — NOVEL CORONAVIRUS, NAA: SARS-CoV-2, NAA: NOT DETECTED

## 2019-04-25 ENCOUNTER — Other Ambulatory Visit: Payer: Self-pay

## 2019-04-25 ENCOUNTER — Ambulatory Visit (INDEPENDENT_AMBULATORY_CARE_PROVIDER_SITE_OTHER): Payer: BC Managed Care – PPO | Admitting: Family Medicine

## 2019-04-25 ENCOUNTER — Encounter: Payer: Self-pay | Admitting: Family Medicine

## 2019-04-25 VITALS — BP 122/70 | HR 80 | Temp 97.8°F | Resp 16 | Ht 71.5 in | Wt 238.0 lb

## 2019-04-25 DIAGNOSIS — I160 Hypertensive urgency: Secondary | ICD-10-CM | POA: Diagnosis not present

## 2019-04-25 DIAGNOSIS — N50812 Left testicular pain: Secondary | ICD-10-CM | POA: Diagnosis not present

## 2019-04-25 LAB — URINALYSIS, ROUTINE W REFLEX MICROSCOPIC
Bilirubin Urine: NEGATIVE
Glucose, UA: NEGATIVE
Hgb urine dipstick: NEGATIVE
Ketones, ur: NEGATIVE
Leukocytes,Ua: NEGATIVE
Nitrite: NEGATIVE
Protein, ur: NEGATIVE
Specific Gravity, Urine: 1.025 (ref 1.001–1.03)
pH: 7 (ref 5.0–8.0)

## 2019-04-25 MED ORDER — DICLOFENAC SODIUM 75 MG PO TBEC: 75.0000 mg | DELAYED_RELEASE_TABLET | Freq: Two times a day (BID) | ORAL | 0 refills | Status: DC

## 2019-04-25 MED ORDER — LISINOPRIL 10 MG PO TABS: 10.0000 mg | ORAL_TABLET | Freq: Every day | ORAL | 5 refills | Status: DC

## 2019-04-25 NOTE — Progress Notes (Signed)
Subjective:    Patient ID: Paul Lee, male    DOB: [birth year], 24 y.o.   MRN: [id number]  HPI  01/07/19 Patient is a very pleasant 24 year old Caucasian male here today for a follow-up of his blood pressure.  Patient states that in the past his systolic blood pressure would be as high as 200.  Approximately year ago he was started on lisinopril 10 mg a day.  This medicine has worked excellent.  His systolic blood pressures consistently between 120 and 130.  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  Here today is well controlled.  He denies any side effects from the medication.  Specifically he denies any cough, chest pain, angioedema, shortness of breath.  Overall he is doing well with no concerns.  Recently he lifted a wall behind concrete saw and felt a pop in his back.  After that he was experiencing pain in his left lower flank.  He would also have some numbness and tingling "like his legs were falling asleep" radiating down both legs at times.  However this improved after a few days of taking ibuprofen.  Now he has some mild tenderness in his left flank but outside of that his pain is much improved.  He has a negative straight leg raise today.  He has normal reflexes checked at the patella and at the Achilles.  He has 5/5 muscle strength equal and symmetric in both legs.  At that time, my plan was: Blood pressure today is outstanding.  I will check a CBC, CMP, fasting lipid panel.  Assuming his lab work is good I would make no changes in his lisinopril as it seems to be working well.  I believe the patient likely strained a muscle in his lower back and possibly could have had a slight disc herniation given his neuropathic symptoms.  However he is now essentially asymptomatic and therefore no further treatment is necessary as long as his emptiness continue to remain resolved.  04/25/19 Patient is a very pleasant 24 year old Caucasian male who presents today  with left testicular pain.  Patient was cutting trees on Monday.  He was cutting a tree at the base with a chain saw.  He was stepping of her brush at the same time.  Somehow the tree snapped back up and hit him directly in the scrotum with a high impact force.  The patient has had moderate intensity pain ever since.  He states that yesterday it was throbbing.  He has been taking Tylenol with minimal relief.  He denies any gross hematuria.  He reports normal urine output.  He denies any dysuria.  On exam today, the patient's urinalysis is completely normal.  There is no evidence of blood.  Patient denies any fevers or chills or abdominal pain.  The pain is more in his left inguinal canal than in the testicle itself.  The left testicle is slightly tender to the touch.  It is round and smooth and symmetric.  There is no palpable cyst or hematoma around the testicle.  There is no visible bruising or swelling.  There is no hernia.  The testicle has a normal cremasteric reflex.  Right testicle is slightly larger.  It is nontender without any swelling.  Patient is mildly tender to palpation along the spermatic cord and tender to deep palpation in the inguinal canal. Past Medical History:  Diagnosis Date  . Chest pain   . Heart murmur    No  past surgical history on file. Current Outpatient Medications on File Prior to Visit  Medication Sig Dispense Refill  . amoxicillin (AMOXIL) 875 MG tablet Take 1 tablet (875 mg total) by mouth 2 (two) times daily. 20 tablet 0  . aspirin EC 81 MG tablet Take 81 mg by mouth once as needed (chest pain).    Marland Kitchen lisinopril (ZESTRIL) 10 MG tablet Take 1 tablet by mouth once daily 30 tablet 5   No current facility-administered medications on file prior to visit.   No Known Allergies Social History   Socioeconomic History  . Marital status: Single    Spouse name: Not on file  . Number of children: Not on file  . Years of education: Not on file  . Highest education level:  Associate degree: occupational, Hotel manager, or vocational program  Occupational History  . Occupation: [employer]  Tobacco Use  . Smoking status: Current Some Day Smoker  . Smokeless tobacco: Former Systems developer  . Tobacco comment: in the process of quitting  Substance and Sexual Activity  . Alcohol use: Yes    Alcohol/week: 2.0 - 3.0 standard drinks    Types: 2 - 3 Cans of beer per week  . Drug use: No  . Sexual activity: Yes    Birth control/protection: Condom  Other Topics Concern  . Not on file  Social History Narrative  . Not on file   Social Determinants of Health   Financial Resource Strain:   . Difficulty of Paying Living Expenses: Not on file  Food Insecurity:   . Worried About Charity fundraiser in the Last Year: Not on file  . Ran Out of Food in the Last Year: Not on file  Transportation Needs:   . Lack of Transportation (Medical): Not on file  . Lack of Transportation (Non-Medical): Not on file  Physical Activity:   . Days of Exercise per Week: Not on file  . Minutes of Exercise per Session: Not on file  Stress:   . Feeling of Stress : Not on file  Social Connections:   . Frequency of Communication with Friends and Family: Not on file  . Frequency of Social Gatherings with Friends and Family: Not on file  . Attends Religious Services: Not on file  . Active Member of Clubs or Organizations: Not on file  . Attends Archivist Meetings: Not on file  . Marital Status: Not on file  Intimate Partner Violence:   . Fear of Current or Ex-Partner: Not on file  . Emotionally Abused: Not on file  . Physically Abused: Not on file  . Sexually Abused: Not on file     Review of Systems     Objective:   Physical Exam Vitals reviewed.  Constitutional:      Appearance: Normal appearance. He is normal weight.  Cardiovascular:     Rate and Rhythm: Normal rate and regular rhythm.     Heart sounds: Normal heart sounds. No murmur.  Pulmonary:     Effort: No respiratory  distress.     Breath sounds: Normal breath sounds. No stridor. No wheezing, rhonchi or rales.  Abdominal:     General: Abdomen is flat. Bowel sounds are normal.     Palpations: Abdomen is soft.  Genitourinary:    Penis: Normal and circumcised. No erythema, tenderness or swelling.      Testes: Cremasteric reflex is present.        Right: Mass, tenderness, testicular hydrocele or varicocele not present.  Left: Tenderness present. Mass, swelling, testicular hydrocele or varicocele not present.     Epididymis:     Right: Normal.     Left: Normal.     Tanner stage (genital): 5.    Musculoskeletal:     Right lower leg: No edema.     Left lower leg: No edema.  Neurological:     Mental Status: He is alert.           Assessment & Plan:  Testicular pain, left - Plan: Urinalysis, Routine w reflex microscopic  There is no evidence of testicular torsion or testicular rupture.  I believe the patient suffered a contusion to the testicle along with the spermatic cord.  Begin diclofenac 75 mg twice daily and recommended rest and elevation and ice.  If pain intensifies would recommend a scrotal ultrasound.  However there is no evidence of an acute process at the present time.

## 2019-04-26 ENCOUNTER — Ambulatory Visit: Payer: BC Managed Care – PPO | Admitting: Family Medicine

## 2019-11-28 ENCOUNTER — Other Ambulatory Visit: Payer: Self-pay

## 2019-11-28 ENCOUNTER — Encounter: Payer: Self-pay | Admitting: Family Medicine

## 2019-11-28 ENCOUNTER — Ambulatory Visit (INDEPENDENT_AMBULATORY_CARE_PROVIDER_SITE_OTHER): Payer: BC Managed Care – PPO | Admitting: Family Medicine

## 2019-11-28 VITALS — BP 120/58 | HR 73 | Temp 98.1°F | Ht 71.0 in | Wt 247.0 lb

## 2019-11-28 DIAGNOSIS — I10 Essential (primary) hypertension: Secondary | ICD-10-CM

## 2019-11-28 DIAGNOSIS — Z0001 Encounter for general adult medical examination with abnormal findings: Secondary | ICD-10-CM

## 2019-11-28 DIAGNOSIS — Z114 Encounter for screening for human immunodeficiency virus [HIV]: Secondary | ICD-10-CM | POA: Diagnosis not present

## 2019-11-28 DIAGNOSIS — Z0000 Encounter for general adult medical examination without abnormal findings: Secondary | ICD-10-CM

## 2019-11-28 NOTE — Progress Notes (Signed)
Subjective:    Patient ID: Paul Lee, male    DOB: 10-24-1995, 24 y.o.   MRN: [id number]  HPI Patient is a very pleasant 24 year old Caucasian male here today for a physical exam.  He had some country ham this morning and therefore his lab work is not fasting.  He is taking his lisinopril.  Blood pressure today is excellent at 120/58.  Unfortunately the patient smokes occasionally.  He is also due for Covid vaccination.  I did recommend the Covid vaccine today and we discussed this at length.  Also recommended smoking cessation.  Otherwise he has been doing well with no concerns. Past Medical History:  Diagnosis Date  . Chest pain   . Heart murmur    No past surgical history on file. Current Outpatient Medications on File Prior to Visit  Medication Sig Dispense Refill  . lisinopril (ZESTRIL) 10 MG tablet Take 1 tablet (10 mg total) by mouth daily. 30 tablet 5   No current facility-administered medications on file prior to visit.   No Known Allergies Social History   Socioeconomic History  . Marital status: Single    Spouse name: Not on file  . Number of children: Not on file  . Years of education: Not on file  . Highest education level: Associate degree: occupational, Scientist, product/process development, or vocational program  Occupational History  . Occupation: [employer]  Tobacco Use  . Smoking status: Current Some Day Smoker  . Smokeless tobacco: Former Neurosurgeon  . Tobacco comment: in the process of quitting  Vaping Use  . Vaping Use: Never used  Substance and Sexual Activity  . Alcohol use: Yes    Alcohol/week: 2.0 - 3.0 standard drinks    Types: 2 - 3 Cans of beer per week  . Drug use: No  . Sexual activity: Yes    Birth control/protection: Condom  Other Topics Concern  . Not on file  Social History Narrative  . Not on file   Social Determinants of Health   Financial Resource Strain:   . Difficulty of Paying Living Expenses:   Food Insecurity:   . Worried About Programme researcher, broadcasting/film/video in  the Last Year:   . Barista in the Last Year:   Transportation Needs:   . Freight forwarder (Medical):   Marland Kitchen Lack of Transportation (Non-Medical):   Physical Activity:   . Days of Exercise per Week:   . Minutes of Exercise per Session:   Stress:   . Feeling of Stress :   Social Connections:   . Frequency of Communication with Friends and Family:   . Frequency of Social Gatherings with Friends and Family:   . Attends Religious Services:   . Active Member of Clubs or Organizations:   . Attends Banker Meetings:   Marland Kitchen Marital Status:   Intimate Partner Violence:   . Fear of Current or Ex-Partner:   . Emotionally Abused:   Marland Kitchen Physically Abused:   . Sexually Abused:      Review of Systems     Objective:   Physical Exam Vitals reviewed.  Constitutional:      General: He is not in acute distress.    Appearance: Normal appearance. He is normal weight. He is not ill-appearing, toxic-appearing or diaphoretic.  HENT:     Head: Normocephalic.     Right Ear: Tympanic membrane, ear canal and external ear normal. There is no impacted cerumen.     Left Ear: Tympanic membrane, ear canal  and external ear normal. There is no impacted cerumen.     Nose: Nose normal. No congestion or rhinorrhea.     Mouth/Throat:     Mouth: Mucous membranes are moist.     Pharynx: No oropharyngeal exudate or posterior oropharyngeal erythema.  Eyes:     General:        Right eye: No discharge.        Left eye: No discharge.     Extraocular Movements: Extraocular movements intact.     Conjunctiva/sclera: Conjunctivae normal.     Pupils: Pupils are equal, round, and reactive to light.  Neck:     Vascular: No carotid bruit.  Cardiovascular:     Rate and Rhythm: Normal rate and regular rhythm.     Heart sounds: Normal heart sounds. No murmur heard.  No friction rub. No gallop.   Pulmonary:     Effort: No respiratory distress.     Breath sounds: Normal breath sounds. No stridor. No  wheezing, rhonchi or rales.  Chest:     Chest wall: No tenderness.  Abdominal:     General: Abdomen is flat. Bowel sounds are normal. There is no distension.     Palpations: Abdomen is soft. There is no mass.     Tenderness: There is no abdominal tenderness. There is no guarding or rebound.     Hernia: No hernia is present.  Musculoskeletal:     Cervical back: Normal range of motion. No rigidity or tenderness.     Lumbar back: No spasms, tenderness or bony tenderness. Normal range of motion.       Back:     Right lower leg: No edema.     Left lower leg: No edema.  Lymphadenopathy:     Cervical: No cervical adenopathy.  Skin:    Findings: No erythema or lesion.  Neurological:     General: No focal deficit present.     Mental Status: He is alert and oriented to person, place, and time.     Cranial Nerves: No cranial nerve deficit.     Motor: No weakness.     Coordination: Coordination normal.     Gait: Gait normal.     Deep Tendon Reflexes: Reflexes normal.  Psychiatric:        Mood and Affect: Mood normal.        Behavior: Behavior normal.        Thought Content: Thought content normal.        Judgment: Judgment normal.           Assessment & Plan:  Benign essential HTN - Plan: CBC with Differential/Platelet, COMPLETE METABOLIC PANEL WITH GFR  Encounter for screening for human immunodeficiency virus (HIV) - Plan: HIV Antibody (routine testing w rflx)  General medical exam  I am very happy with his blood pressure.  Continue lisinopril 10 mg a day.  Check CBC and CMP today.  Patient would also like to be screened for HIV.  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  He declines hepatitis C screening.  Strongly recommended the Covid vaccine.  Regular anticipatory guidance provided.

## 2019-11-29 LAB — COMPLETE METABOLIC PANEL WITH GFR
AG Ratio: 2 (calc) (ref 1.0–2.5)
ALT: 22 U/L (ref 9–46)
AST: 12 U/L (ref 10–40)
Albumin: 4.8 g/dL (ref 3.6–5.1)
Alkaline phosphatase (APISO): 44 U/L (ref 36–130)
BUN: 16 mg/dL (ref 7–25)
CO2: 26 mmol/L (ref 20–32)
Calcium: 9.7 mg/dL (ref 8.6–10.3)
Chloride: 104 mmol/L (ref 98–110)
Creat: 1.11 mg/dL (ref 0.60–1.35)
GFR, Est African American: 109 mL/min/{1.73_m2} (ref >=60)
GFR, Est Non African American: 94 mL/min/{1.73_m2} (ref >=60)
Globulin: 2.4 g/dL (calc) (ref 1.9–3.7)
Glucose, Bld: 85 mg/dL (ref 65–99)
Potassium: 4 mmol/L (ref 3.5–5.3)
Sodium: 138 mmol/L (ref 135–146)
Total Bilirubin: 0.7 mg/dL (ref 0.2–1.2)
Total Protein: 7.2 g/dL (ref 6.1–8.1)

## 2019-11-29 LAB — CBC WITH DIFFERENTIAL/PLATELET
Absolute Monocytes: 612 cells/uL (ref 200–950)
Basophils Absolute: 27 cells/uL (ref 0–200)
Basophils Relative: 0.4 %
Eosinophils Absolute: 122 cells/uL (ref 15–500)
Eosinophils Relative: 1.8 %
HCT: 47.3 % (ref 38.5–50.0)
Hemoglobin: 16.5 g/dL (ref 13.2–17.1)
Lymphs Abs: 1843 cells/uL (ref 850–3900)
MCH: 31.3 pg (ref 27.0–33.0)
MCHC: 34.9 g/dL (ref 32.0–36.0)
MCV: 89.6 fL (ref 80.0–100.0)
MPV: 11.5 fL (ref 7.5–12.5)
Monocytes Relative: 9 %
Neutro Abs: 4196 cells/uL (ref 1500–7800)
Neutrophils Relative %: 61.7 %
Platelets: 238 10*3/uL (ref 140–400)
RBC: 5.28 10*6/uL (ref 4.20–5.80)
RDW: 12.3 % (ref 11.0–15.0)
Total Lymphocyte: 27.1 %
WBC: 6.8 10*3/uL (ref 3.8–10.8)

## 2019-11-29 LAB — HIV ANTIBODY (ROUTINE TESTING W REFLEX): HIV 1&2 Ab, 4th Generation: NONREACTIVE

## 2020-01-01 ENCOUNTER — Telehealth: Payer: Self-pay

## 2020-01-01 NOTE — Telephone Encounter (Signed)
Pt called to report that he got into a tick nest and pulled off about 200 ticks. I advised pt to go to [facility] asap to avoid a reaction from the bites because Dr. Tanya Nones is out of the office and Dr. Jeanice Lim has no available appts. Pt agreed that he would go to [facility] when he gets off of work.

## 2020-02-12 ENCOUNTER — Other Ambulatory Visit: Payer: Self-pay | Admitting: Family Medicine

## 2020-02-12 DIAGNOSIS — I160 Hypertensive urgency: Secondary | ICD-10-CM

## 2020-05-28 ENCOUNTER — Other Ambulatory Visit: Payer: Self-pay | Admitting: Family Medicine

## 2020-05-28 DIAGNOSIS — I160 Hypertensive urgency: Secondary | ICD-10-CM

## 2020-08-25 ENCOUNTER — Other Ambulatory Visit: Payer: Self-pay | Admitting: Family Medicine

## 2020-08-25 DIAGNOSIS — I160 Hypertensive urgency: Secondary | ICD-10-CM

## 2020-08-25 MED ORDER — LISINOPRIL 10 MG PO TABS: 1.0000 | ORAL_TABLET | Freq: Every day | ORAL | 0 refills | Status: DC

## 2020-10-01 ENCOUNTER — Ambulatory Visit: Payer: BC Managed Care – PPO | Admitting: Family Medicine

## 2020-11-23 ENCOUNTER — Other Ambulatory Visit: Payer: Self-pay | Admitting: Family Medicine

## 2020-11-23 DIAGNOSIS — I160 Hypertensive urgency: Secondary | ICD-10-CM

## 2020-11-23 MED ORDER — LISINOPRIL 10 MG PO TABS: 10.0000 mg | ORAL_TABLET | Freq: Every day | ORAL | 0 refills | Status: DC

## 2021-02-21 ENCOUNTER — Other Ambulatory Visit: Payer: Self-pay | Admitting: Family Medicine

## 2021-02-21 DIAGNOSIS — I160 Hypertensive urgency: Secondary | ICD-10-CM

## 2021-02-22 MED ORDER — LISINOPRIL 10 MG PO TABS: 10.0000 mg | ORAL_TABLET | Freq: Every day | ORAL | 0 refills | Status: DC

## 2021-03-02 ENCOUNTER — Other Ambulatory Visit: Payer: Self-pay

## 2021-03-02 ENCOUNTER — Encounter: Payer: Self-pay | Admitting: Family Medicine

## 2021-03-02 ENCOUNTER — Ambulatory Visit (INDEPENDENT_AMBULATORY_CARE_PROVIDER_SITE_OTHER): Payer: BC Managed Care – PPO | Admitting: Family Medicine

## 2021-03-02 VITALS — BP 138/68 | HR 60 | Temp 97.6°F | Resp 18 | Ht 71.0 in | Wt 263.0 lb

## 2021-03-02 DIAGNOSIS — I10 Essential (primary) hypertension: Secondary | ICD-10-CM

## 2021-03-02 DIAGNOSIS — L509 Urticaria, unspecified: Secondary | ICD-10-CM | POA: Diagnosis not present

## 2021-03-02 MED ORDER — HYDROXYZINE PAMOATE 25 MG PO CAPS: 25.0000 mg | ORAL_CAPSULE | Freq: Three times a day (TID) | ORAL | 0 refills | Status: DC | PRN

## 2021-03-02 NOTE — Progress Notes (Signed)
Subjective:    Patient ID: Paul Lee, male    DOB: [birth year], 25 y.o.   MRN: [id number]  HPI Patient is here today for recheck of his blood pressure.  He is currently on lisinopril.  His blood pressure is adequately controlled at 138/68.  He denies any chest pain shortness of breath or dyspnea on exertion.  He has been getting a rash intermittently over the last few years.  He states that he will suddenly break out in hives on his torso.  He has pictures for me to review today.  The picture showed diffuse erythematous macules that are coalescing into large patches and plaques.  This it typically occurs on his flank and on his back and on his chest.  He seldom sees it on his arms or his legs.  It does not itch.  It can last hours or a couple days and tends to resolve spontaneously on its own.  He has not determined any trigger such as exercise, heat, stress, or ingestion Past Medical History:  Diagnosis Date   Chest pain    Heart murmur    History reviewed. No pertinent surgical history. Current Outpatient Medications on File Prior to Visit  Medication Sig Dispense Refill   lisinopril (ZESTRIL) 10 MG tablet Take 1 tablet (10 mg total) by mouth daily. 30 tablet 0   omeprazole (PRILOSEC OTC) 20 MG tablet Take 20 mg by mouth daily.     No current facility-administered medications on file prior to visit.   No Known Allergies Social History   Socioeconomic History   Marital status: Married    Spouse name: Not on file   Number of children: Not on file   Years of education: Not on file   Highest education level: Associate degree: occupational, Scientist, product/process development, or vocational program  Occupational History   Occupation: [employer]  Tobacco Use   Smoking status: Some Days   Smokeless tobacco: Former   Tobacco comments:    in the process of quitting  Vaping Use   Vaping Use: Never used  Substance and Sexual Activity   Alcohol use: Yes    Alcohol/week: 2.0 - 3.0 standard drinks    Types: 2  - 3 Cans of beer per week   Drug use: No   Sexual activity: Yes    Birth control/protection: Condom  Other Topics Concern   Not on file  Social History Narrative   Not on file   Social Determinants of Health   Financial Resource Strain: Not on file  Food Insecurity: Not on file  Transportation Needs: Not on file  Physical Activity: Not on file  Stress: Not on file  Social Connections: Not on file  Intimate Partner Violence: Not on file     Review of Systems     Objective:   Physical Exam Vitals reviewed.  Constitutional:      General: He is not in acute distress.    Appearance: Normal appearance. He is normal weight. He is not ill-appearing, toxic-appearing or diaphoretic.  HENT:     Head: Normocephalic.     Right Ear: Tympanic membrane, ear canal and external ear normal. There is no impacted cerumen.     Left Ear: Tympanic membrane, ear canal and external ear normal. There is no impacted cerumen.     Nose: Nose normal. No congestion or rhinorrhea.     Mouth/Throat:     Mouth: Mucous membranes are moist.     Pharynx: No oropharyngeal exudate or posterior oropharyngeal erythema.  Eyes:     General:        Right eye: No discharge.        Left eye: No discharge.     Extraocular Movements: Extraocular movements intact.     Conjunctiva/sclera: Conjunctivae normal.     Pupils: Pupils are equal, round, and reactive to light.  Neck:     Vascular: No carotid bruit.  Cardiovascular:     Rate and Rhythm: Normal rate and regular rhythm.     Heart sounds: Normal heart sounds. No murmur heard.   No friction rub. No gallop.  Pulmonary:     Effort: No respiratory distress.     Breath sounds: Normal breath sounds. No stridor. No wheezing, rhonchi or rales.  Chest:     Chest wall: No tenderness.  Abdominal:     General: Abdomen is flat. Bowel sounds are normal. There is no distension.     Palpations: Abdomen is soft. There is no mass.     Tenderness: There is no abdominal  tenderness. There is no guarding or rebound.     Hernia: No hernia is present.  Musculoskeletal:     Cervical back: Normal range of motion.     Right lower leg: No edema.     Left lower leg: No edema.  Lymphadenopathy:     Cervical: No cervical adenopathy.  Skin:    Findings: No erythema or lesion.  Neurological:     General: No focal deficit present.     Mental Status: He is alert and oriented to person, place, and time.     Cranial Nerves: No cranial nerve deficit.     Motor: No weakness.     Coordination: Coordination normal.     Gait: Gait normal.     Deep Tendon Reflexes: Reflexes normal.  Psychiatric:        Mood and Affect: Mood normal.        Behavior: Behavior normal.        Thought Content: Thought content normal.        Judgment: Judgment normal.          Assessment & Plan:  Benign essential HTN - Plan: COMPLETE METABOLIC PANEL WITH GFR, CBC with Differential/Platelet Blood pressure is adequately controlled.  I will check a CBC and a CMP.  Given the fact the patient is not fasting I cannot check a lipid panel.  I do believe that the rash the patient experiencing is due to urticaria.  I do not believe that this is an allergic reaction.  He can use hydroxyzine 25 mg every 8 hours as needed for rash when it occurs as it tends to occur very sporadically

## 2021-03-03 LAB — CBC WITH DIFFERENTIAL/PLATELET
Absolute Monocytes: 491 cells/uL (ref 200–950)
Basophils Absolute: 19 cells/uL (ref 0–200)
Basophils Relative: 0.3 %
Eosinophils Absolute: 151 cells/uL (ref 15–500)
Eosinophils Relative: 2.4 %
HCT: 47 % (ref 38.5–50.0)
Hemoglobin: 16.3 g/dL (ref 13.2–17.1)
Lymphs Abs: 2331 cells/uL (ref 850–3900)
MCH: 31.1 pg (ref 27.0–33.0)
MCHC: 34.7 g/dL (ref 32.0–36.0)
MCV: 89.7 fL (ref 80.0–100.0)
MPV: 11.4 fL (ref 7.5–12.5)
Monocytes Relative: 7.8 %
Neutro Abs: 3308 cells/uL (ref 1500–7800)
Neutrophils Relative %: 52.5 %
Platelets: 238 10*3/uL (ref 140–400)
RBC: 5.24 10*6/uL (ref 4.20–5.80)
RDW: 12.3 % (ref 11.0–15.0)
Total Lymphocyte: 37 %
WBC: 6.3 10*3/uL (ref 3.8–10.8)

## 2021-03-03 LAB — COMPLETE METABOLIC PANEL WITH GFR
AG Ratio: 2 (calc) (ref 1.0–2.5)
ALT: 38 U/L (ref 9–46)
AST: 16 U/L (ref 10–40)
Albumin: 4.8 g/dL (ref 3.6–5.1)
Alkaline phosphatase (APISO): 44 U/L (ref 36–130)
BUN: 16 mg/dL (ref 7–25)
CO2: 30 mmol/L (ref 20–32)
Calcium: 9.6 mg/dL (ref 8.6–10.3)
Chloride: 104 mmol/L (ref 98–110)
Creat: 1.01 mg/dL (ref 0.60–1.24)
Globulin: 2.4 g/dL (calc) (ref 1.9–3.7)
Glucose, Bld: 99 mg/dL (ref 65–99)
Potassium: 4.1 mmol/L (ref 3.5–5.3)
Sodium: 142 mmol/L (ref 135–146)
Total Bilirubin: 0.4 mg/dL (ref 0.2–1.2)
Total Protein: 7.2 g/dL (ref 6.1–8.1)
eGFR: 107 mL/min/{1.73_m2} (ref >=60)

## 2021-03-24 ENCOUNTER — Other Ambulatory Visit: Payer: Self-pay | Admitting: Family Medicine

## 2021-03-24 DIAGNOSIS — I160 Hypertensive urgency: Secondary | ICD-10-CM

## 2021-03-24 MED ORDER — LISINOPRIL 10 MG PO TABS: 10.0000 mg | ORAL_TABLET | Freq: Every day | ORAL | 0 refills | Status: DC

## 2021-04-24 ENCOUNTER — Other Ambulatory Visit: Payer: Self-pay | Admitting: Family Medicine

## 2021-04-24 DIAGNOSIS — I160 Hypertensive urgency: Secondary | ICD-10-CM

## 2021-04-26 MED ORDER — LISINOPRIL 10 MG PO TABS: 10.0000 mg | ORAL_TABLET | Freq: Every day | ORAL | 3 refills | Status: DC

## 2021-09-15 ENCOUNTER — Encounter: Payer: Self-pay | Admitting: Family Medicine

## 2021-09-15 ENCOUNTER — Telehealth: Payer: Self-pay

## 2021-09-15 NOTE — Telephone Encounter (Signed)
Pt called stated that he spoke with you taking on his wife as a new pt? I advice pt that since I did not get any notification/msg from you, I can not make appt for wife. Stated because you are not here, I can take a message and check with you before I make an appointment for pt's wife.  Pt said, ok, I'll call Dr. Tanya Nones and rudely hang the phone.

## 2021-09-16 NOTE — Telephone Encounter (Signed)
Please call pt and get wife's information. Per Dr. Tanya Nones, will see her as a new pt.   Thank you.

## 2021-09-16 NOTE — Telephone Encounter (Signed)
Please call pt and let him know Dr. Tanya Nones will take his wife as a new pt.  Thank you

## 2022-04-19 ENCOUNTER — Other Ambulatory Visit: Payer: Self-pay | Admitting: Family Medicine

## 2022-04-19 ENCOUNTER — Other Ambulatory Visit: Payer: Self-pay

## 2022-04-19 DIAGNOSIS — I160 Hypertensive urgency: Secondary | ICD-10-CM

## 2022-04-19 MED ORDER — LISINOPRIL 10 MG PO TABS: 10.0000 mg | ORAL_TABLET | Freq: Every day | ORAL | 0 refills | Status: DC

## 2022-05-19 ENCOUNTER — Encounter: Payer: Self-pay | Admitting: Family Medicine

## 2022-05-19 ENCOUNTER — Other Ambulatory Visit: Payer: Self-pay | Admitting: Family Medicine

## 2022-05-19 ENCOUNTER — Ambulatory Visit (INDEPENDENT_AMBULATORY_CARE_PROVIDER_SITE_OTHER): Payer: BC Managed Care – PPO | Admitting: Family Medicine

## 2022-05-19 VITALS — BP 122/74 | HR 48 | Ht 71.0 in | Wt 275.0 lb

## 2022-05-19 DIAGNOSIS — Z0001 Encounter for general adult medical examination with abnormal findings: Secondary | ICD-10-CM | POA: Diagnosis not present

## 2022-05-19 DIAGNOSIS — I160 Hypertensive urgency: Secondary | ICD-10-CM

## 2022-05-19 DIAGNOSIS — Z0000 Encounter for general adult medical examination without abnormal findings: Secondary | ICD-10-CM

## 2022-05-19 DIAGNOSIS — I10 Essential (primary) hypertension: Secondary | ICD-10-CM | POA: Diagnosis not present

## 2022-05-19 MED ORDER — LORATADINE 10 MG PO TABS: 10.0000 mg | ORAL_TABLET | Freq: Every day | ORAL | 11 refills | Status: AC

## 2022-05-19 NOTE — Progress Notes (Signed)
Subjective:    Patient ID: Paul Lee, male    DOB: 1995/06/26, 27 y.o.   MRN: [id number]  HPI  Patient is a very pleasant 27 year old Caucasian gentleman here today for complete physical exam.  He still suffers from urticaria.  He was unable to tolerate the hydroxyzine because it made him too groggy.  He has pin down but it tends to occur when he gets hot.  He is very bad in the summertime.  Today on exam, he is sweaty I believe due to anxiety.  On his back, there are hives occurring as we speak.  He states this is very common for him.  It occurs at least once a week.  However he does not cause him to experience any angioedema.  It itches minimally.  He is due for a flu shot which he politely declines.  He is due for an RSV shot.  He is due for a COVID shot.  We discussed the risk and benefits and he declines these at the present time. Past Medical History:  Diagnosis Date   Chest pain    Heart murmur    No past surgical history on file. Current Outpatient Medications on File Prior to Visit  Medication Sig Dispense Refill   hydrOXYzine (VISTARIL) 25 MG capsule Take 1 capsule (25 mg total) by mouth every 8 (eight) hours as needed (urticaria). 30 capsule 0   lisinopril (ZESTRIL) 10 MG tablet Take 1 tablet by mouth once daily 90 tablet 0   lisinopril (ZESTRIL) 10 MG tablet Take 1 tablet (10 mg total) by mouth daily. 30 tablet 0   omeprazole (PRILOSEC OTC) 20 MG tablet Take 20 mg by mouth daily.     No current facility-administered medications on file prior to visit.   No Known Allergies Social History   Socioeconomic History   Marital status: Married    Spouse name: Not on file   Number of children: Not on file   Years of education: Not on file   Highest education level: Associate degree: occupational, Hotel manager, or vocational program  Occupational History   Occupation: [employer]  Tobacco Use   Smoking status: Some Days   Smokeless tobacco: Former   Tobacco comments:    in  the process of quitting  Vaping Use   Vaping Use: Never used  Substance and Sexual Activity   Alcohol use: Yes    Alcohol/week: 2.0 - 3.0 standard drinks of alcohol    Types: 2 - 3 Cans of beer per week   Drug use: No   Sexual activity: Yes    Birth control/protection: Condom  Other Topics Concern   Not on file  Social History Narrative   Not on file   Social Determinants of Health   Financial Resource Strain: Low Risk  (02/01/2018)   Overall Financial Resource Strain (CARDIA)    Difficulty of Paying Living Expenses: Not hard at all  Food Insecurity: No Food Insecurity (02/01/2018)   Hunger Vital Sign    Worried About Running Out of Food in the Last Year: Never true    Ran Out of Food in the Last Year: Never true  Transportation Needs: No Transportation Needs (02/01/2018)   PRAPARE - Hydrologist (Medical): No    Lack of Transportation (Non-Medical): No  Physical Activity: Sufficiently Active (02/01/2018)   Exercise Vital Sign    Days of Exercise per Week: 5 days    Minutes of Exercise per Session: 150+ min  Stress:  No Stress Concern Present (02/01/2018)   [place]    Feeling of Stress : Not at all  Social Connections: [place] (02/01/2018)   Social Connection and Isolation Panel [NHANES]    Frequency of Communication with Friends and Family: More than three times a week    Frequency of Social Gatherings with Friends and Family: More than three times a week    Attends Religious Services: More than 4 times per year    Active Member of [employer] or Organizations: Yes    Attends Archivist Meetings: More than 4 times per year    Marital Status: Living with partner  Intimate Partner Violence: Not At Risk (02/01/2018)   Humiliation, Afraid, Rape, and Kick questionnaire    Fear of Current or Ex-Partner: No    Emotionally Abused: No    Physically Abused: No     Sexually Abused: No     Review of Systems     Objective:   Physical Exam Vitals reviewed.  Constitutional:      General: He is not in acute distress.    Appearance: Normal appearance. He is normal weight. He is not ill-appearing, toxic-appearing or diaphoretic.  HENT:     Head: Normocephalic.     Right Ear: Tympanic membrane, ear canal and external ear normal. There is no impacted cerumen.     Left Ear: Tympanic membrane, ear canal and external ear normal. There is no impacted cerumen.     Nose: Nose normal. No congestion or rhinorrhea.     Mouth/Throat:     Mouth: Mucous membranes are moist.     Pharynx: No oropharyngeal exudate or posterior oropharyngeal erythema.  Eyes:     General:        Right eye: No discharge.        Left eye: No discharge.     Extraocular Movements: Extraocular movements intact.     Conjunctiva/sclera: Conjunctivae normal.     Pupils: Pupils are equal, round, and reactive to light.  Neck:     Vascular: No carotid bruit.  Cardiovascular:     Rate and Rhythm: Normal rate and regular rhythm.     Heart sounds: Normal heart sounds. No murmur heard.    No friction rub. No gallop.  Pulmonary:     Effort: No respiratory distress.     Breath sounds: Normal breath sounds. No stridor. No wheezing, rhonchi or rales.  Chest:     Chest wall: No tenderness.  Abdominal:     General: Abdomen is flat. Bowel sounds are normal. There is no distension.     Palpations: Abdomen is soft. There is no mass.     Tenderness: There is no abdominal tenderness. There is no guarding or rebound.     Hernia: No hernia is present.  Musculoskeletal:     Cervical back: Normal range of motion.     Right lower leg: No edema.     Left lower leg: No edema.  Lymphadenopathy:     Cervical: No cervical adenopathy.  Skin:    Findings: Rash present. No erythema or lesion.  Neurological:     General: No focal deficit present.     Mental Status: He is alert and oriented to person,  place, and time.     Cranial Nerves: No cranial nerve deficit.     Motor: No weakness.     Coordination: Coordination normal.     Gait: Gait normal.  Deep Tendon Reflexes: Reflexes normal.  Psychiatric:        Mood and Affect: Mood normal.        Behavior: Behavior normal.        Thought Content: Thought content normal.        Judgment: Judgment normal.           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  I will try him on Claritin 10 mg daily as a preventative.  If this helps some, we could even consider starting him on Pepcid and discontinuing the omeprazole.  Check CBC CMP and a lipid panel.  Recommended a flu shot and COVID booster.

## 2022-05-20 ENCOUNTER — Other Ambulatory Visit: Payer: Self-pay

## 2022-05-20 DIAGNOSIS — I160 Hypertensive urgency: Secondary | ICD-10-CM

## 2022-05-20 LAB — COMPLETE METABOLIC PANEL WITH GFR
AG Ratio: 2.2 (calc) (ref 1.0–2.5)
ALT: 57 U/L — ABNORMAL HIGH (ref 9–46)
AST: 22 U/L (ref 10–40)
Albumin: 5.3 g/dL — ABNORMAL HIGH (ref 3.6–5.1)
Alkaline phosphatase (APISO): 45 U/L (ref 36–130)
BUN: 18 mg/dL (ref 7–25)
CO2: 29 mmol/L (ref 20–32)
Calcium: 10.3 mg/dL (ref 8.6–10.3)
Chloride: 103 mmol/L (ref 98–110)
Creat: 0.92 mg/dL (ref 0.60–1.24)
Globulin: 2.4 g/dL (calc) (ref 1.9–3.7)
Glucose, Bld: 92 mg/dL (ref 65–99)
Potassium: 4.4 mmol/L (ref 3.5–5.3)
Sodium: 140 mmol/L (ref 135–146)
Total Bilirubin: 0.8 mg/dL (ref 0.2–1.2)
Total Protein: 7.7 g/dL (ref 6.1–8.1)
eGFR: 118 mL/min/{1.73_m2} (ref >=60)

## 2022-05-20 LAB — CBC WITH DIFFERENTIAL/PLATELET
Absolute Monocytes: 496 cells/uL (ref 200–950)
Basophils Absolute: 23 cells/uL (ref 0–200)
Basophils Relative: 0.4 %
Eosinophils Absolute: 108 cells/uL (ref 15–500)
Eosinophils Relative: 1.9 %
HCT: 47.4 % (ref 38.5–50.0)
Hemoglobin: 16.7 g/dL (ref 13.2–17.1)
Lymphs Abs: 2149 cells/uL (ref 850–3900)
MCH: 30.8 pg (ref 27.0–33.0)
MCHC: 35.2 g/dL (ref 32.0–36.0)
MCV: 87.5 fL (ref 80.0–100.0)
MPV: 10.9 fL (ref 7.5–12.5)
Monocytes Relative: 8.7 %
Neutro Abs: 2924 cells/uL (ref 1500–7800)
Neutrophils Relative %: 51.3 %
Platelets: 304 10*3/uL (ref 140–400)
RBC: 5.42 10*6/uL (ref 4.20–5.80)
RDW: 12.2 % (ref 11.0–15.0)
Total Lymphocyte: 37.7 %
WBC: 5.7 10*3/uL (ref 3.8–10.8)

## 2022-05-20 LAB — LIPID PANEL
Cholesterol: 152 mg/dL (ref <200)
HDL: 35 mg/dL — ABNORMAL LOW (ref >=40)
LDL Cholesterol (Calc): 95 mg/dL (calc)
Non-HDL Cholesterol (Calc): 117 mg/dL (calc) (ref <130)
Total CHOL/HDL Ratio: 4.3 (calc) (ref <5.0)
Triglycerides: 120 mg/dL (ref <150)

## 2022-05-20 MED ORDER — LISINOPRIL 10 MG PO TABS: 10.0000 mg | ORAL_TABLET | Freq: Every day | ORAL | 3 refills | Status: DC

## 2022-05-20 MED ORDER — LISINOPRIL 10 MG PO TABS: 10.0000 mg | ORAL_TABLET | Freq: Every day | ORAL | 0 refills | Status: DC

## 2022-08-12 ENCOUNTER — Ambulatory Visit: Payer: BC Managed Care – PPO | Admitting: Family Medicine

## 2022-08-12 VITALS — BP 124/68 | HR 64 | Temp 98.2°F | Ht 71.0 in | Wt 281.0 lb

## 2022-08-12 DIAGNOSIS — M25512 Pain in left shoulder: Secondary | ICD-10-CM

## 2022-08-12 MED ORDER — PREDNISONE 20 MG PO TABS: ORAL_TABLET | ORAL | 0 refills | Status: DC

## 2022-08-12 NOTE — Progress Notes (Signed)
Subjective:    Patient ID: Paul Lee, male    DOB: 07-Feb-1996, 27 y.o.   MRN: [id number]  Shoulder Pain    Patient presents today with pain in his left shoulder.  He states that it hurts to lift her greater than 90 degrees.  However he has passive range of motion to 180 without any pain.  He has a negative empty can sign.  He has good strength with resisted shoulder abduction.  He has negative Hawkins sign.  There is no crepitus with passive range of motion in his shoulder.  However he does report pain radiating into his neck all the way to the base of his skull down into his left posterior shoulder.  It is a burning intense pain.  It was so bad 1 night that he stated that he laid in bed and cried it hurts so bad.  On exam today, there is no shingles rash.  I am unable to elicit any pain with palpation in the neck or in the trapezius muscle.  He does have diminished brachioradialis reflexes in his left arm and diminished biceps reflex in his left arm.  He denies any numbness or tingling or decreased strength. Past Medical History:  Diagnosis Date   Chest pain    Heart murmur    Hypertension    No past surgical history on file. Current Outpatient Medications on File Prior to Visit  Medication Sig Dispense Refill   lisinopril (ZESTRIL) 10 MG tablet Take 1 tablet (10 mg total) by mouth daily. 90 tablet 0   lisinopril (ZESTRIL) 10 MG tablet Take 1 tablet (10 mg total) by mouth daily. 90 tablet 3   loratadine (CLARITIN) 10 MG tablet Take 1 tablet (10 mg total) by mouth daily. 30 tablet 11   omeprazole (PRILOSEC OTC) 20 MG tablet Take 20 mg by mouth daily.     No current facility-administered medications on file prior to visit.   No Known Allergies Social History   Socioeconomic History   Marital status: Married    Spouse name: Not on file   Number of children: Not on file   Years of education: Not on file   Highest education level: Associate degree: occupational, Scientist, product/process development, or  vocational program  Occupational History   Occupation: [employer]  Tobacco Use   Smoking status: Some Days   Smokeless tobacco: Former   Tobacco comments:    in the process of quitting  Vaping Use   Vaping Use: Never used  Substance and Sexual Activity   Alcohol use: Yes    Alcohol/week: 2.0 - 3.0 standard drinks of alcohol    Types: 2 - 3 Cans of beer per week   Drug use: No   Sexual activity: Yes    Birth control/protection: Condom  Other Topics Concern   Not on file  Social History Narrative   Not on file   Social Determinants of Health   Financial Resource Strain: Low Risk  (02/01/2018)   Overall Financial Resource Strain (CARDIA)    Difficulty of Paying Living Expenses: Not hard at all  Food Insecurity: No Food Insecurity (02/01/2018)   Hunger Vital Sign    Worried About Running Out of Food in the Last Year: Never true    Ran Out of Food in the Last Year: Never true  Transportation Needs: No Transportation Needs (02/01/2018)   PRAPARE - Administrator, Civil Service (Medical): No    Lack of Transportation (Non-Medical): No  Physical Activity: Sufficiently  Active (02/01/2018)   Exercise Vital Sign    Days of Exercise per Week: 5 days    Minutes of Exercise per Session: 150+ min  Stress: No Stress Concern Present (02/01/2018)   [employer] of Occupational Health - Occupational Stress Questionnaire    Feeling of Stress : Not at all  Social Connections: Socially Integrated (02/01/2018)   Social Connection and Isolation Panel [NHANES]    Frequency of Communication with Friends and Family: More than three times a week    Frequency of Social Gatherings with Friends and Family: More than three times a week    Attends Religious Services: More than 4 times per year    Active Member of [employer] or Organizations: Yes    Attends Banker Meetings: More than 4 times per year    Marital Status: Living with partner  Intimate Partner Violence: Not At Risk  (02/01/2018)   Humiliation, Afraid, Rape, and Kick questionnaire    Fear of Current or Ex-Partner: No    Emotionally Abused: No    Physically Abused: No    Sexually Abused: No     Review of Systems     Objective:   Physical Exam Vitals reviewed.  Constitutional:      General: He is not in acute distress.    Appearance: Normal appearance. He is normal weight. He is not ill-appearing, toxic-appearing or diaphoretic.  HENT:     Head: Normocephalic.  Neck:     Vascular: No carotid bruit.   Cardiovascular:     Rate and Rhythm: Normal rate and regular rhythm.     Heart sounds: Normal heart sounds. No murmur heard.    No friction rub. No gallop.  Pulmonary:     Effort: No respiratory distress.     Breath sounds: Normal breath sounds. No stridor. No wheezing, rhonchi or rales.  Chest:     Chest wall: No tenderness.  Musculoskeletal:     Left shoulder: No tenderness, bony tenderness or crepitus. Normal range of motion. Normal strength.     Cervical back: Normal range of motion.     Right lower leg: No edema.     Left lower leg: No edema.  Lymphadenopathy:     Cervical: No cervical adenopathy.  Skin:    Findings: No lesion.  Neurological:     General: No focal deficit present.     Mental Status: He is alert and oriented to person, place, and time.     Cranial Nerves: No cranial nerve deficit.     Motor: No weakness.     Coordination: Coordination normal.     Gait: Gait normal.     Deep Tendon Reflexes: Reflexes normal.  Psychiatric:        Mood and Affect: Mood normal.        Behavior: Behavior normal.        Thought Content: Thought content normal.        Judgment: Judgment normal.           Assessment & Plan:  Acute pain of left shoulder Patient's history and exam is not conclusive but I suspect cervical radiculopathy.  Physical exam do not demonstrate any rotator cuff pathology.  We will try the patient on a prednisone taper pack for cervical radiculopathy and  reassess in 1 week.

## 2022-10-11 ENCOUNTER — Telehealth: Payer: Self-pay

## 2022-10-11 NOTE — Telephone Encounter (Signed)
Pt called in stating that at his dentist appt today, he had some xrays taken. Pt states that dentist informed pt he should get a referral to a ENT specialist. Pt stated that xray showed a place in his nasal passage that may need to be looked at by pcp or ENT dr. Please advise.  Cb#: [phone]

## 2022-10-14 ENCOUNTER — Other Ambulatory Visit: Payer: Self-pay

## 2022-10-14 DIAGNOSIS — R6889 Other general symptoms and signs: Secondary | ICD-10-CM

## 2022-10-17 ENCOUNTER — Other Ambulatory Visit: Payer: Self-pay | Admitting: Family Medicine

## 2022-10-17 DIAGNOSIS — J3489 Other specified disorders of nose and nasal sinuses: Secondary | ICD-10-CM

## 2022-11-29 ENCOUNTER — Ambulatory Visit: Payer: BC Managed Care – PPO | Admitting: Family Medicine

## 2022-11-29 VITALS — BP 120/64 | HR 81 | Temp 98.3°F | Ht 71.0 in | Wt 293.0 lb

## 2022-11-29 DIAGNOSIS — L237 Allergic contact dermatitis due to plants, except food: Secondary | ICD-10-CM | POA: Diagnosis not present

## 2022-11-29 MED ORDER — CLOBETASOL PROPIONATE 0.05 % EX CREA: 1.0000 | TOPICAL_CREAM | Freq: Two times a day (BID) | CUTANEOUS | 0 refills | Status: DC

## 2022-11-29 NOTE — Progress Notes (Signed)
Subjective:  HPI: Paul Lee is a 27 y.o. male presenting on 11/29/2022 for Acute Visit (shot for posion ivy/slj//)   HPI Patient is in today for exposure to poison ivy with rash to both arms. The rash is limited to the wrist and upper forearm bilaterally and is associated with severe itching. He has tried poison ivy spray without relief. Denies exposure to genitals or face. Has tried Hydroxyzine in the past for itching and this has caused extreme dizziness.  Review of Systems  All other systems reviewed and are negative.   Relevant past medical history reviewed and updated as indicated.   Past Medical History:  Diagnosis Date   Chest pain    Heart murmur    Hypertension      No past surgical history on file.  Allergies and medications reviewed and updated.   Current Outpatient Medications:    clobetasol cream (TEMOVATE) 0.05 %, Apply 1 Application topically 2 (two) times daily., Disp: 30 g, Rfl: 0   lisinopril (ZESTRIL) 10 MG tablet, Take 1 tablet (10 mg total) by mouth daily., Disp: 90 tablet, Rfl: 3   loratadine (CLARITIN) 10 MG tablet, Take 1 tablet (10 mg total) by mouth daily., Disp: 30 tablet, Rfl: 11   omeprazole (PRILOSEC OTC) 20 MG tablet, Take 20 mg by mouth daily., Disp: , Rfl:    predniSONE (DELTASONE) 20 MG tablet, 3 tabs poqday 1-2, 2 tabs poqday 3-4, 1 tab poqday 5-6 (Patient not taking: Reported on 11/29/2022), Disp: 12 tablet, Rfl: 0  No Known Allergies  Objective:   BP 120/64   Pulse 81   Temp 98.3 F (36.8 C) (Oral)   Ht 5\' 11"  (1.803 m)   Wt 293 lb (132.9 kg)   SpO2 99%   BMI 40.87 kg/m      11/29/2022    2:50 PM 08/12/2022    3:15 PM 05/19/2022   10:23 AM  Vitals with BMI  Height 5\' 11"  5\' 11"  5\' 11"   Weight 293 lbs 281 lbs 275 lbs  BMI 40.88 39.21 38.37  Systolic 120 124 284  Diastolic 64 68 74  Pulse 81 64 48     Physical Exam Vitals and nursing note reviewed.  Constitutional:      Appearance: Normal appearance. He is normal  weight.  HENT:     Head: Normocephalic and atraumatic.  Skin:    General: Skin is warm and dry.     Capillary Refill: Capillary refill takes less than 2 seconds.     Findings: Rash present. Rash is vesicular.          Comments: Clusters of erythematous vesicles with linear distribution  Neurological:     General: No focal deficit present.     Mental Status: He is alert and oriented to person, place, and time. Mental status is at baseline.  Psychiatric:        Mood and Affect: Mood normal.        Behavior: Behavior normal.        Thought Content: Thought content normal.        Judgment: Judgment normal.     Assessment & Plan:  Allergic dermatitis due to poison ivy Assessment & Plan: May use oatmeal baths and cool, wet compresses in addition to Benadryl for itching. Start Clobetasol Propionate BID to affected area. Avoid the allergen. Return to office if symptoms persist or worsen.   Other orders -     Clobetasol Propionate; Apply 1 Application topically 2 (two) times  daily.  Dispense: 30 g; Refill: 0     Follow up plan: Return if symptoms worsen or fail to improve.  Park Meo, FNP

## 2022-11-29 NOTE — Assessment & Plan Note (Signed)
May use oatmeal baths and cool, wet compresses in addition to Benadryl for itching. Start Clobetasol Propionate BID to affected area. Avoid the allergen. Return to office if symptoms persist or worsen.

## 2022-12-20 DIAGNOSIS — J342 Deviated nasal septum: Secondary | ICD-10-CM | POA: Insufficient documentation

## 2022-12-20 DIAGNOSIS — J341 Cyst and mucocele of nose and nasal sinus: Secondary | ICD-10-CM | POA: Insufficient documentation

## 2023-05-25 ENCOUNTER — Encounter: Payer: BC Managed Care – PPO | Admitting: Family Medicine

## 2023-06-01 ENCOUNTER — Encounter: Payer: Self-pay | Admitting: Family Medicine

## 2023-06-01 ENCOUNTER — Ambulatory Visit (INDEPENDENT_AMBULATORY_CARE_PROVIDER_SITE_OTHER): Payer: 59 | Admitting: Family Medicine

## 2023-06-01 VITALS — BP 120/66 | HR 85 | Temp 98.6°F | Ht 71.0 in | Wt 293.8 lb

## 2023-06-01 DIAGNOSIS — Z23 Encounter for immunization: Secondary | ICD-10-CM | POA: Diagnosis not present

## 2023-06-01 DIAGNOSIS — Z0000 Encounter for general adult medical examination without abnormal findings: Secondary | ICD-10-CM | POA: Diagnosis not present

## 2023-06-01 MED ORDER — PHENTERMINE HCL 37.5 MG PO CAPS: 37.5000 mg | ORAL_CAPSULE | ORAL | 2 refills | Status: DC

## 2023-06-01 NOTE — Progress Notes (Signed)
Subjective:    Patient ID: Paul Lee, male    DOB: [birth year], 28 y.o.   MRN: [id number]  HPI Patient is a very pleasant 28 year old Caucasian gentleman who presents today for complete physical exam.  Patient weighs 293 pounds.  This gives him a BMI of 40.98.  He has a history of hypertension.  He also has nicotine dependence.  Therefore I am very concerned about his weight.  We discussed weight loss strategies.  He is interested in phentermine due to cost.  He is obviously working with diet and exercise but he would like to try the phentermine has an appetite suppressant.  We also discussed GLP-1 agonist.  The patient will check with his insurance regarding this.  Patient is due for a Tdap.  His wife is pregnant and about to give birth to their first child and therefore he needs this for protection against whooping cough.  We also discussed the flu shot and the patient politely declines the flu shot.  I also discussed RSV with the patient and recommended an RSV vaccine in the early fall.  Past Medical History:  Diagnosis Date   Chest pain    Heart murmur    Hypertension    No past surgical history on file. Current Outpatient Medications on File Prior to Visit  Medication Sig Dispense Refill   clobetasol cream (TEMOVATE) 0.05 % Apply 1 Application topically 2 (two) times daily. 30 g 0   lisinopril (ZESTRIL) 10 MG tablet Take 1 tablet (10 mg total) by mouth daily. 90 tablet 3   loratadine (CLARITIN) 10 MG tablet Take 1 tablet (10 mg total) by mouth daily. 30 tablet 11   omeprazole (PRILOSEC OTC) 20 MG tablet Take 20 mg by mouth daily.     predniSONE (DELTASONE) 20 MG tablet 3 tabs poqday 1-2, 2 tabs poqday 3-4, 1 tab poqday 5-6 (Patient not taking: Reported on 11/29/2022) 12 tablet 0   No current facility-administered medications on file prior to visit.   No Known Allergies Social History   Socioeconomic History   Marital status: Married    Spouse name: Not on file   Number of  children: Not on file   Years of education: Not on file   Highest education level: Associate degree: occupational, Scientist, product/process development, or vocational program  Occupational History   Occupation: [employer]  Tobacco Use   Smoking status: Some Days   Smokeless tobacco: Former   Tobacco comments:    in the process of quitting  Vaping Use   Vaping status: Never Used  Substance and Sexual Activity   Alcohol use: Yes    Alcohol/week: 2.0 - 3.0 standard drinks of alcohol    Types: 2 - 3 Cans of beer per week   Drug use: No   Sexual activity: Yes    Birth control/protection: Condom  Other Topics Concern   Not on file  Social History Narrative   Not on file   Social Drivers of Health   Financial Resource Strain: Low Risk  (02/01/2018)   Overall Financial Resource Strain (CARDIA)    Difficulty of Paying Living Expenses: Not hard at all  Food Insecurity: Low Risk  (12/20/2022)   Received from [facility]   Hunger Vital Sign    Worried About Running Out of Food in the Last Year: Never true    Ran Out of Food in the Last Year: Never true  Transportation Needs: No Transportation Needs (12/20/2022)   Received from [facility]  In the past 12 months, has lack of reliable transportation kept you from medical appointments, meetings, work or from getting things needed for daily living? : No  Physical Activity: Sufficiently Active (02/01/2018)   Exercise Vital Sign    Days of Exercise per Week: 5 days    Minutes of Exercise per Session: 150+ min  Stress: No Stress Concern Present (02/01/2018)   [employer] of Occupational Health - Occupational Stress Questionnaire    Feeling of Stress : Not at all  Social Connections: Socially Integrated (02/01/2018)   Social Connection and Isolation Panel [NHANES]    Frequency of Communication with Friends and Family: More than three times a week    Frequency of Social Gatherings with Friends and Family: More than three times a week    Attends  Religious Services: More than 4 times per year    Active Member of [employer] or Organizations: Yes    Attends Banker Meetings: More than 4 times per year    Marital Status: Living with partner  Intimate Partner Violence: Not At Risk (02/01/2018)   Humiliation, Afraid, Rape, and Kick questionnaire    Fear of Current or Ex-Partner: No    Emotionally Abused: No    Physically Abused: No    Sexually Abused: No     Review of Systems     Objective:   Physical Exam Vitals reviewed.  Constitutional:      General: He is not in acute distress.    Appearance: Normal appearance. He is normal weight. He is not ill-appearing, toxic-appearing or diaphoretic.  HENT:     Head: Normocephalic.     Right Ear: Tympanic membrane, ear canal and external ear normal. There is no impacted cerumen.     Left Ear: Tympanic membrane, ear canal and external ear normal. There is no impacted cerumen.     Nose: Nose normal. No congestion or rhinorrhea.     Mouth/Throat:     Mouth: Mucous membranes are moist.     Pharynx: No oropharyngeal exudate or posterior oropharyngeal erythema.  Eyes:     General:        Right eye: No discharge.        Left eye: No discharge.     Extraocular Movements: Extraocular movements intact.     Conjunctiva/sclera: Conjunctivae normal.     Pupils: Pupils are equal, round, and reactive to light.  Neck:     Vascular: No carotid bruit.  Cardiovascular:     Rate and Rhythm: Normal rate and regular rhythm.     Heart sounds: Normal heart sounds. No murmur heard.    No friction rub. No gallop.  Pulmonary:     Effort: No respiratory distress.     Breath sounds: Normal breath sounds. No stridor. No wheezing, rhonchi or rales.  Chest:     Chest wall: No tenderness.  Abdominal:     General: Abdomen is flat. Bowel sounds are normal. There is no distension.     Palpations: Abdomen is soft. There is no mass.     Tenderness: There is no abdominal tenderness. There is no  guarding or rebound.     Hernia: No hernia is present.  Musculoskeletal:     Cervical back: Normal range of motion.     Right lower leg: No edema.     Left lower leg: No edema.  Lymphadenopathy:     Cervical: No cervical adenopathy.  Skin:    Findings: No erythema or lesion.  Neurological:  General: No focal deficit present.     Mental Status: He is alert and oriented to person, place, and time.     Cranial Nerves: No cranial nerve deficit.     Motor: No weakness.     Coordination: Coordination normal.     Gait: Gait normal.     Deep Tendon Reflexes: Reflexes normal.  Psychiatric:        Mood and Affect: Mood normal.        Behavior: Behavior normal.        Thought Content: Thought content normal.        Judgment: Judgment normal.    Patient has 2 cyst.  There is a 2 cm cyst in the center of his back roughly around the level of T7.  He also has a smaller 1 cm cyst on his right shoulder blade.       Assessment & Plan:  General medical exam - Plan: CBC with Differential/Platelet, COMPLETE METABOLIC PANEL WITH GFR, Lipid panel Recommended that the patient quit using oral tobacco products.  However I did congratulate him on smoking cessation.  Patient received Tdap today.  Recommended RSV in the fall.  Patient declined a flu shot.  Return fasting for CBC a CMP and a fasting lipid panel.  I will start the patient on phentermine 37.5 mg daily for 90 days.  I cautioned the patient to watch his blood pressure.  Also encouraged him to work on diet and exercise

## 2023-06-05 ENCOUNTER — Other Ambulatory Visit: Payer: 59

## 2023-06-06 LAB — COMPLETE METABOLIC PANEL WITH GFR
AG Ratio: 2.2 (calc) (ref 1.0–2.5)
ALT: 49 U/L — ABNORMAL HIGH (ref 9–46)
AST: 19 U/L (ref 10–40)
Albumin: 5.5 g/dL — ABNORMAL HIGH (ref 3.6–5.1)
Alkaline phosphatase (APISO): 50 U/L (ref 36–130)
BUN: 19 mg/dL (ref 7–25)
CO2: 29 mmol/L (ref 20–32)
Calcium: 10.6 mg/dL — ABNORMAL HIGH (ref 8.6–10.3)
Chloride: 102 mmol/L (ref 98–110)
Creat: 0.96 mg/dL (ref 0.60–1.24)
Globulin: 2.5 g/dL (ref 1.9–3.7)
Glucose, Bld: 90 mg/dL (ref 65–99)
Potassium: 4.4 mmol/L (ref 3.5–5.3)
Sodium: 142 mmol/L (ref 135–146)
Total Bilirubin: 1 mg/dL (ref 0.2–1.2)
Total Protein: 8 g/dL (ref 6.1–8.1)
eGFR: 112 mL/min/{1.73_m2} (ref >=60)

## 2023-06-06 LAB — CBC WITH DIFFERENTIAL/PLATELET
Absolute Lymphocytes: 2050 {cells}/uL (ref 850–3900)
Absolute Monocytes: 540 {cells}/uL (ref 200–950)
Basophils Absolute: 30 {cells}/uL (ref 0–200)
Basophils Relative: 0.6 %
Eosinophils Absolute: 150 {cells}/uL (ref 15–500)
Eosinophils Relative: 3 %
HCT: 49.4 % (ref 38.5–50.0)
Hemoglobin: 16.9 g/dL (ref 13.2–17.1)
MCH: 30.6 pg (ref 27.0–33.0)
MCHC: 34.2 g/dL (ref 32.0–36.0)
MCV: 89.5 fL (ref 80.0–100.0)
MPV: 11.1 fL (ref 7.5–12.5)
Monocytes Relative: 10.8 %
Neutro Abs: 2230 {cells}/uL (ref 1500–7800)
Neutrophils Relative %: 44.6 %
Platelets: 298 10*3/uL (ref 140–400)
RBC: 5.52 10*6/uL (ref 4.20–5.80)
RDW: 12.2 % (ref 11.0–15.0)
Total Lymphocyte: 41 %
WBC: 5 10*3/uL (ref 3.8–10.8)

## 2023-06-06 LAB — LIPID PANEL
Cholesterol: 154 mg/dL (ref <200)
HDL: 36 mg/dL — ABNORMAL LOW (ref >=40)
LDL Cholesterol (Calc): 99 mg/dL
Non-HDL Cholesterol (Calc): 118 mg/dL (ref <130)
Total CHOL/HDL Ratio: 4.3 (calc) (ref <5.0)
Triglycerides: 91 mg/dL (ref <150)

## 2023-06-30 ENCOUNTER — Encounter (HOSPITAL_COMMUNITY): Payer: Self-pay

## 2023-06-30 ENCOUNTER — Emergency Department (HOSPITAL_COMMUNITY)
Admission: EM | Admit: 2023-06-30 | Discharge: 2023-06-30 | Disposition: A | Discharge status: Home / Self Care | Attending: Emergency Medicine | Admitting: Emergency Medicine

## 2023-06-30 ENCOUNTER — Emergency Department (HOSPITAL_COMMUNITY)

## 2023-06-30 ENCOUNTER — Other Ambulatory Visit: Payer: Self-pay

## 2023-06-30 DIAGNOSIS — N202 Calculus of kidney with calculus of ureter: Secondary | ICD-10-CM | POA: Insufficient documentation

## 2023-06-30 DIAGNOSIS — Z79899 Other long term (current) drug therapy: Secondary | ICD-10-CM | POA: Diagnosis not present

## 2023-06-30 DIAGNOSIS — R109 Unspecified abdominal pain: Secondary | ICD-10-CM | POA: Diagnosis present

## 2023-06-30 DIAGNOSIS — I10 Essential (primary) hypertension: Secondary | ICD-10-CM | POA: Diagnosis not present

## 2023-06-30 DIAGNOSIS — N200 Calculus of kidney: Secondary | ICD-10-CM

## 2023-06-30 LAB — BASIC METABOLIC PANEL
Anion gap: 15 (ref 5–15)
BUN: 21 mg/dL — ABNORMAL HIGH (ref 6–20)
CO2: 20 mmol/L — ABNORMAL LOW (ref 22–32)
Calcium: 10.2 mg/dL (ref 8.9–10.3)
Chloride: 104 mmol/L (ref 98–111)
Creatinine, Ser: 1.11 mg/dL (ref 0.61–1.24)
GFR, Estimated: >60 mL/min (ref >60)
Glucose, Bld: 151 mg/dL — ABNORMAL HIGH (ref 70–99)
Potassium: 3 mmol/L — ABNORMAL LOW (ref 3.5–5.1)
Sodium: 139 mmol/L (ref 135–145)

## 2023-06-30 LAB — CBC WITH DIFFERENTIAL/PLATELET
Abs Immature Granulocytes: 0.03 10*3/uL (ref 0.00–0.07)
Basophils Absolute: 0 10*3/uL (ref 0.0–0.1)
Basophils Relative: 0 %
Eosinophils Absolute: 0.1 10*3/uL (ref 0.0–0.5)
Eosinophils Relative: 1 %
HCT: 43 % (ref 39.0–52.0)
Hemoglobin: 16 g/dL (ref 13.0–17.0)
Immature Granulocytes: 0 %
Lymphocytes Relative: 18 %
Lymphs Abs: 1.8 10*3/uL (ref 0.7–4.0)
MCH: 31 pg (ref 26.0–34.0)
MCHC: 37.2 g/dL — ABNORMAL HIGH (ref 30.0–36.0)
MCV: 83.3 fL (ref 80.0–100.0)
Monocytes Absolute: 0.8 10*3/uL (ref 0.1–1.0)
Monocytes Relative: 9 %
Neutro Abs: 6.9 10*3/uL (ref 1.7–7.7)
Neutrophils Relative %: 72 %
Platelets: 298 10*3/uL (ref 150–400)
RBC: 5.16 MIL/uL (ref 4.22–5.81)
RDW: 11.8 % (ref 11.5–15.5)
WBC: 9.6 10*3/uL (ref 4.0–10.5)
nRBC: 0 % (ref 0.0–0.2)

## 2023-06-30 LAB — URINALYSIS, ROUTINE W REFLEX MICROSCOPIC
Bacteria, UA: NONE SEEN
Bilirubin Urine: NEGATIVE
Glucose, UA: NEGATIVE mg/dL
Hgb urine dipstick: NEGATIVE
Ketones, ur: 20 mg/dL — AB
Leukocytes,Ua: NEGATIVE
Nitrite: NEGATIVE
Protein, ur: 100 mg/dL — AB
Specific Gravity, Urine: 1.035 — ABNORMAL HIGH (ref 1.005–1.030)
pH: 7 (ref 5.0–8.0)

## 2023-06-30 MED ORDER — KETOROLAC TROMETHAMINE 30 MG/ML IJ SOLN
15.0000 mg | Freq: Once | INTRAMUSCULAR | Status: AC
  Administered 2023-06-30: 15 mg via INTRAVENOUS
  Filled 2023-06-30: qty 1

## 2023-06-30 MED ORDER — OXYCODONE-ACETAMINOPHEN 5-325 MG PO TABS: 1.0000 | ORAL_TABLET | Freq: Four times a day (QID) | ORAL | 0 refills | Status: DC | PRN

## 2023-06-30 MED ORDER — HYDROMORPHONE HCL 1 MG/ML IJ SOLN
1.0000 mg | Freq: Once | INTRAMUSCULAR | Status: AC
  Administered 2023-06-30: 1 mg via INTRAVENOUS
  Filled 2023-06-30: qty 1

## 2023-06-30 MED ORDER — HYDROMORPHONE HCL 1 MG/ML IJ SOLN
0.5000 mg | Freq: Once | INTRAMUSCULAR | Status: AC
  Administered 2023-06-30: 0.5 mg via INTRAVENOUS
  Filled 2023-06-30: qty 0.5

## 2023-06-30 MED ORDER — ONDANSETRON HCL 4 MG/2ML IJ SOLN
4.0000 mg | Freq: Once | INTRAMUSCULAR | Status: AC
  Administered 2023-06-30: 4 mg via INTRAVENOUS
  Filled 2023-06-30: qty 2

## 2023-06-30 MED ORDER — OXYCODONE-ACETAMINOPHEN 5-325 MG PO TABS: 1.0000 | ORAL_TABLET | ORAL | 0 refills | Status: DC | PRN

## 2023-06-30 MED ORDER — ONDANSETRON HCL 4 MG PO TABS: 4.0000 mg | ORAL_TABLET | Freq: Four times a day (QID) | ORAL | 0 refills | Status: DC

## 2023-06-30 NOTE — ED Provider Notes (Signed)
 [place] EMERGENCY DEPARTMENT AT [facility] Provider Note   CSN: [id number] Arrival date & time: 06/30/23  1926     History  Chief Complaint  Patient presents with   Flank Pain    Paul Lee is a 28 y.o. male with history of HTN presenting with right sided flank pain, nausea and vomiting which started suddenly around 1830 tonight.  He endorses having episodes of similar right flank pain earlier this week, but not intense like currently. He has found no alleviators for his symptoms prior to arrival, he denies fevers,  hematuria, abdominal distention, but endorses pain radiating to his bladder.  He does endorse urinating less than normal since yesterday.   He denies history of kidney stones.    The history is provided by the patient.       Home Medications Prior to Admission medications   Medication Sig Start Date End Date Taking? Authorizing Provider  ondansetron (ZOFRAN) 4 MG tablet Take 1 tablet (4 mg total) by mouth every 6 (six) hours. 06/30/23  Yes Kasandra Fehr, Raynelle Fanning, PA-C  oxyCODONE-acetaminophen (PERCOCET/ROXICET) 5-325 MG tablet Take 1 tablet by mouth every 4 (four) hours as needed. 06/30/23  Yes Lachele Lievanos, Raynelle Fanning, PA-C  oxyCODONE-acetaminophen (PERCOCET/ROXICET) 5-325 MG tablet Take 1 tablet by mouth every 6 (six) hours as needed for severe pain (pain score 7-10). 06/30/23  Yes Chantelle Verdi, Raynelle Fanning, PA-C  clobetasol cream (TEMOVATE) 0.05 % Apply 1 Application topically 2 (two) times daily. 11/29/22   Park Meo, FNP  lisinopril (ZESTRIL) 10 MG tablet Take 1 tablet (10 mg total) by mouth daily. 05/20/22   Donita Brooks, MD  loratadine (CLARITIN) 10 MG tablet Take 1 tablet (10 mg total) by mouth daily. Patient not taking: Reported on 06/01/2023 05/19/22   Donita Brooks, MD  omeprazole (PRILOSEC OTC) 20 MG tablet Take 20 mg by mouth daily.    [provider]  phentermine 37.5 MG capsule Take 1 capsule (37.5 mg total) by mouth every morning. 06/01/23   Donita Brooks, MD      Allergies    Patient has no known allergies.    Review of Systems   Review of Systems  Constitutional:  Negative for fever.  HENT:  Negative for congestion and sore throat.   Eyes: Negative.   Respiratory:  Negative for chest tightness and shortness of breath.   Cardiovascular:  Negative for chest pain.  Gastrointestinal:  Positive for nausea and vomiting. Negative for abdominal pain.  Genitourinary:  Positive for flank pain. Negative for dysuria and hematuria.  Musculoskeletal:  Negative for arthralgias, joint swelling and neck pain.  Skin: Negative.  Negative for rash and wound.  Neurological:  Negative for dizziness, weakness, light-headedness, numbness and headaches.  Psychiatric/Behavioral: Negative.      Physical Exam Updated Vital Signs BP (!) 140/80   Pulse 96   Temp 98 F (36.7 C) (Oral)   Ht 6' (1.829 m)   Wt 124.7 kg   SpO2 97%   BMI 37.30 kg/m  Physical Exam Vitals and nursing note reviewed.  Constitutional:      General: He is in acute distress.     Appearance: He is well-developed. He is diaphoretic.  HENT:     Head: Normocephalic and atraumatic.  Eyes:     Conjunctiva/sclera: Conjunctivae normal.  Cardiovascular:     Rate and Rhythm: Normal rate and regular rhythm.     Heart sounds: Normal heart sounds.  Pulmonary:     Effort: Pulmonary effort is  normal.     Breath sounds: Normal breath sounds. No wheezing.  Abdominal:     General: Bowel sounds are normal. There is no distension.     Palpations: Abdomen is soft.     Tenderness: There is no abdominal tenderness. There is right CVA tenderness. There is no guarding.  Musculoskeletal:        General: Normal range of motion.     Cervical back: Normal range of motion.  Skin:    General: Skin is warm.  Neurological:     Mental Status: He is alert.     ED Results / Procedures / Treatments   Labs (all labs ordered are listed, but only abnormal results are displayed) Labs Reviewed   BASIC METABOLIC PANEL - Abnormal; Notable for the following components:      Result Value   Potassium 3.0 (*)    CO2 20 (*)    Glucose, Bld 151 (*)    BUN 21 (*)    All other components within normal limits  CBC WITH DIFFERENTIAL/PLATELET - Abnormal; Notable for the following components:   MCHC 37.2 (*)    All other components within normal limits  URINALYSIS, ROUTINE W REFLEX MICROSCOPIC - Abnormal; Notable for the following components:   APPearance HAZY (*)    Specific Gravity, Urine 1.035 (*)    Ketones, ur 20 (*)    Protein, ur 100 (*)    All other components within normal limits    EKG None  Radiology CT Renal Stone Study Result Date: 06/30/2023 CLINICAL DATA:  Abdominal/flank pain, stone suspected EXAM: CT ABDOMEN AND PELVIS WITHOUT CONTRAST TECHNIQUE: Multidetector CT imaging of the abdomen and pelvis was performed following the standard protocol without IV contrast. RADIATION DOSE REDUCTION: This exam was performed according to the departmental dose-optimization program which includes automated exposure control, adjustment of the mA and/or kV according to patient size and/or use of iterative reconstruction technique. COMPARISON:  CT head 06/30/2012 FINDINGS: Lower chest: No acute abnormality. Hepatobiliary: No focal liver abnormality. No gallstones, gallbladder wall thickening, or pericholecystic fluid. No biliary dilatation. Pancreas: No focal lesion. Normal pancreatic contour. No surrounding inflammatory changes. No main pancreatic ductal dilatation. Spleen: Normal in size without focal abnormality. Adrenals/Urinary Tract: No adrenal nodule bilaterally. Punctate right ureterovesicular junction stone. No right nephrolithiasis. No left nephrolithiasis. No left ureterolithiasis. No hydronephrosis bilaterally. The urinary bladder is fully decompressed and grossly unremarkable. Stomach/Bowel: Stomach is within normal limits. No evidence of bowel wall thickening or dilatation. Appendix  appears normal. Vascular/Lymphatic: No abdominal aorta or iliac aneurysm. Mild atherosclerotic plaque of the aorta and its branches. No abdominal, pelvic, or inguinal lymphadenopathy. Reproductive: Prostate is unremarkable. Other: No intraperitoneal free fluid. No intraperitoneal free gas. No organized fluid collection. Musculoskeletal: No abdominal wall hernia or abnormality. No suspicious lytic or blastic osseous lesions. No acute displaced fracture. IMPRESSION: Nonobstructive punctate right punctate ureterovesicular junction stone. Electronically Signed   By: Tish Frederickson M.D.   On: 06/30/2023 22:00    Procedures Procedures    Medications Ordered in ED Medications  ondansetron (ZOFRAN) injection 4 mg (4 mg Intravenous Given 06/30/23 2006)  HYDROmorphone (DILAUDID) injection 1 mg (1 mg Intravenous Given 06/30/23 2004)  ketorolac (TORADOL) 30 MG/ML injection 15 mg (15 mg Intravenous Given 06/30/23 2023)  HYDROmorphone (DILAUDID) injection 0.5 mg (0.5 mg Intravenous Given 06/30/23 2224)    ED Course/ Medical Decision Making/ A&P  Medical Decision Making Patient presenting with right flank pain strongly suggesting kidney stone, differential also includes UTI/pyelonephritis,  acute cholecystitis, constipation, musculoskeletal source.  CT T imaging is positive for punctate right UVJ stone.  Patient was given IV fluids, Dilaudid, also given Toradol after which his pain completely resolved.  He was given a urine strainer, referral to urology, strict return precautions were outlined including uncontrolled pain, vomiting or development of fever.  Otherwise plan follow-up with urology, saving his stone if it passes prior to this visit.  Amount and/or Complexity of Data Reviewed Labs: ordered.    Details: Labs reviewed, he does have a glucose of 151, he is not a diabetic, he last ate a meal around noon today.  Patient was advised he should have a fasting repeat CBG which  he can have done by his PCP.  His WBC is normal at 9.6, urinalysis negative for infection. Radiology: ordered.  Risk Prescription drug management.           Final Clinical Impression(s) / ED Diagnoses Final diagnoses:  Kidney stone    Rx / DC Orders ED Discharge Orders          Ordered    oxyCODONE-acetaminophen (PERCOCET/ROXICET) 5-325 MG tablet  Every 4 hours PRN        06/30/23 2215    ondansetron (ZOFRAN) 4 MG tablet  Every 6 hours        06/30/23 2216    oxyCODONE-acetaminophen (PERCOCET/ROXICET) 5-325 MG tablet  Every 6 hours PRN        06/30/23 2216              Victoriano Lain 06/30/23 2330    Eber Hong, MD 07/01/23 [phone]

## 2023-06-30 NOTE — ED Triage Notes (Signed)
 Pt came in with pain on the right side. Started around 1830 this evening pain 10/10. Family stated only peed once yesterday.

## 2023-06-30 NOTE — Discharge Instructions (Signed)
 Make sure you are drinking plenty of fluids to ensure this stone passes.  Strain your urine so you will know when this stone has passed.  I recommend saving it for your urology appointment - call Dr. Ronne Binning for followup care of this kidney stone.    Return here immediately if you develop worsened pain, uncontrolled vomiting or you develop fever.   Do not drive within 4 hours of taking oxycodone - this is prescribed for pain but will cause drowsiness.

## 2023-07-01 MED ORDER — TAMSULOSIN HCL 0.4 MG PO CAPS: 0.4000 mg | ORAL_CAPSULE | Freq: Every day | ORAL | 0 refills | Status: AC

## 2023-07-03 MED FILL — Oxycodone w/ Acetaminophen Tab 5-325 MG: ORAL | Qty: 6 | Status: AC

## 2023-08-15 ENCOUNTER — Other Ambulatory Visit: Payer: Self-pay | Admitting: Family Medicine

## 2023-08-15 DIAGNOSIS — I160 Hypertensive urgency: Secondary | ICD-10-CM

## 2023-11-08 ENCOUNTER — Other Ambulatory Visit: Payer: Self-pay | Admitting: Family Medicine

## 2023-11-08 DIAGNOSIS — I160 Hypertensive urgency: Secondary | ICD-10-CM

## 2024-02-02 ENCOUNTER — Other Ambulatory Visit: Payer: Self-pay | Admitting: Family Medicine

## 2024-02-02 DIAGNOSIS — I160 Hypertensive urgency: Secondary | ICD-10-CM

## 2024-04-19 ENCOUNTER — Ambulatory Visit: Admitting: Family Medicine

## 2024-04-19 ENCOUNTER — Encounter: Payer: Self-pay | Admitting: Family Medicine

## 2024-04-19 VITALS — BP 120/82 | HR 79 | Temp 98.7°F | Ht 72.0 in | Wt 280.0 lb

## 2024-04-19 DIAGNOSIS — F321 Major depressive disorder, single episode, moderate: Secondary | ICD-10-CM | POA: Diagnosis not present

## 2024-04-19 MED ORDER — ESCITALOPRAM OXALATE 10 MG PO TABS: 10.0000 mg | ORAL_TABLET | Freq: Every day | ORAL | 5 refills | Status: AC

## 2024-04-19 NOTE — Progress Notes (Signed)
 "  Subjective:    Patient ID: Paul Lee, male    DOB: 07-17-1995, 29 y.o.   MRN: [id number]  Patient is a very pleasant 29 year old Caucasian gentleman who presents today requesting something for stress and anxiety and depression.  Symptoms began proximately 8 months ago with the birth of his son.  His son is doing well and is very healthy.  Work is going well.  However the patient reports feeling sad and empty for the last 8 months.  He states that he either lays in bed unable to sleep or he does not want to get out of bed and can sleep all day.  He reports anhedonia.  He enjoys hunting and he does not even enjoy getting in the woods anymore.  He reports changes in his appetite.  He reports changes in his concentration.  He reports that 1 time he did have thoughts of being better off if he was not here.  However he states that he has no desire to commit suicide.  He states that he would not act on those thoughts.  He was preemptive and gave his wife's gun safe.  She changed the code so only she has access to the firearms.  However, he is here today to seek help. Past Medical History:  Diagnosis Date   Chest pain    Heart murmur    Hypertension    No past surgical history on file. Current Outpatient Medications on File Prior to Visit  Medication Sig Dispense Refill   lisinopril  (ZESTRIL ) 10 MG tablet Take 1 tablet by mouth once daily 90 tablet 0   loratadine  (CLARITIN ) 10 MG tablet Take 1 tablet (10 mg total) by mouth daily. 30 tablet 11   omeprazole (PRILOSEC OTC) 20 MG tablet Take 20 mg by mouth daily.     clobetasol  cream (TEMOVATE ) 0.05 % Apply 1 Application topically 2 (two) times daily. (Patient not taking: Reported on 04/19/2024) 30 g 0   ondansetron  (ZOFRAN ) 4 MG tablet Take 1 tablet (4 mg total) by mouth every 6 (six) hours. (Patient not taking: Reported on 04/19/2024) 12 tablet 0   oxyCODONE -acetaminophen  (PERCOCET/ROXICET) 5-325 MG tablet Take 1 tablet by mouth every 4  (four) hours as needed. (Patient not taking: Reported on 04/19/2024) 15 tablet 0   oxyCODONE -acetaminophen  (PERCOCET/ROXICET) 5-325 MG tablet Take 1 tablet by mouth every 6 (six) hours as needed for severe pain (pain score 7-10). (Patient not taking: Reported on 04/19/2024) 6 tablet 0   phentermine  37.5 MG capsule Take 1 capsule (37.5 mg total) by mouth every morning. (Patient not taking: Reported on 04/19/2024) 30 capsule 2   No current facility-administered medications on file prior to visit.   No Known Allergies Social History   Socioeconomic History   Marital status: Married    Spouse name: Not on file   Number of children: Not on file   Years of education: Not on file   Highest education level: Associate degree: occupational, scientist, product/process development, or vocational program  Occupational History   Occupation: [employer]  Tobacco Use   Smoking status: Some Days   Smokeless tobacco: Former   Tobacco comments:    in the process of quitting  Vaping Use   Vaping status: Never Used  Substance and Sexual Activity   Alcohol use: Yes    Alcohol/week: 2.0 - 3.0 standard drinks of alcohol    Types: 2 - 3 Cans of beer per week   Drug use: No   Sexual activity: Yes  Birth control/protection: Condom  Other Topics Concern   Not on file  Social History Narrative   Not on file   Social Drivers of Health   Tobacco Use: High Risk (04/19/2024)   Patient History    Smoking Tobacco Use: Some Days    Smokeless Tobacco Use: Former    Passive Exposure: Not on Actuary Strain: Not on file  Food Insecurity: Low Risk (12/20/2022)   Received from [facility]   Epic    Within the past 12 months, you worried that your food would run out before you got money to buy more: Never true    Within the past 12 months, the food you bought just didn't last and you didn't have money to get more. : Never true  Transportation Needs: No Transportation Needs (12/20/2022)   Received from [employer]    In the past 12 months, has lack of reliable transportation kept you from medical appointments, meetings, work or from getting things needed for daily living? : No  Physical Activity: Not on file  Stress: Not on file  Social Connections: Not on file  Intimate Partner Violence: Not on file  Depression (PHQ2-9): Low Risk (06/01/2023)   Depression (PHQ2-9)    PHQ-2 Score: 0  Alcohol Screen: Not on file  Housing: Low Risk (12/20/2022)   Received from [facility]   Epic    What is your living situation today?: I have a steady place to live    Think about the place you live. Do you have problems with any of the following? Choose all that apply:: None/None on this list  Utilities: Low Risk (12/20/2022)   Received from [facility]   Utilities    In the past 12 months has the electric, gas, oil, or water company threatened to shut off services in your home? : No  Health Literacy: Not on file     Review of Systems     Objective:   Physical Exam Vitals reviewed.  Constitutional:      General: He is not in acute distress.    Appearance: Normal appearance. He is normal weight. He is not ill-appearing, toxic-appearing or diaphoretic.  HENT:     Head: Normocephalic.  Cardiovascular:     Rate and Rhythm: Normal rate and regular rhythm.     Heart sounds: Normal heart sounds. No murmur heard.    No friction rub. No gallop.  Pulmonary:     Effort: No respiratory distress.     Breath sounds: Normal breath sounds. No stridor. No wheezing, rhonchi or rales.  Chest:     Chest wall: No tenderness.  Abdominal:     General: Abdomen is flat. Bowel sounds are normal. There is no distension.     Palpations: Abdomen is soft. There is no mass.     Tenderness: There is no abdominal tenderness. There is no guarding or rebound.     Hernia: No hernia is present.  Neurological:     General: No focal deficit present.     Mental Status: He is alert and oriented to person,  place, and time.     Cranial Nerves: No cranial nerve deficit.     Motor: No weakness.     Coordination: Coordination normal.     Gait: Gait normal.     Deep Tendon Reflexes: Reflexes normal.  Psychiatric:        Mood and Affect: Mood normal.  Behavior: Behavior normal.        Thought Content: Thought content normal.        Judgment: Judgment normal.           Assessment & Plan:  Current moderate episode of major depressive disorder without prior episode [facility]) Patient qualifies as major depressive disorder.  We discussed his diagnosis and his options for treatment.  We have elected to start Lexapro 10 mg a day and reassess in 4 weeks.  He will return immediately if symptoms worsen or change in any way "

## 2024-05-02 ENCOUNTER — Other Ambulatory Visit: Payer: Self-pay | Admitting: Family Medicine

## 2024-05-02 DIAGNOSIS — I160 Hypertensive urgency: Secondary | ICD-10-CM

## 2024-05-03 NOTE — Telephone Encounter (Signed)
 Requested Prescriptions  Pending Prescriptions Disp Refills   lisinopril  (ZESTRIL ) 10 MG tablet [Pharmacy Med Name: Lisinopril  10 MG Oral Tablet] 90 tablet 0    Sig: Take 1 tablet by mouth once daily     Cardiovascular:  ACE Inhibitors Failed - 05/03/2024  9:54 AM      Failed - Cr in normal range and within 180 days    Creat  Date Value Ref Range Status  06/05/2023 0.96 0.60 - 1.24 mg/dL Final   Creatinine, Ser  Date Value Ref Range Status  06/30/2023 1.11 0.61 - 1.24 mg/dL Final         Failed - K in normal range and within 180 days    Potassium  Date Value Ref Range Status  06/30/2023 3.0 (L) 3.5 - 5.1 mmol/L Final         Passed - Patient is not pregnant      Passed - Last BP in normal range    BP Readings from Last 1 Encounters:  04/19/24 120/82         Passed - Valid encounter within last 6 months    Recent Outpatient Visits           2 weeks ago Current moderate episode of major depressive disorder without prior episode [facility])   [place] [facility] Duanne Butler DASEN, MD   11 months ago General medical exam   [place] [facility] Duanne Butler DASEN, MD   1 year ago Allergic dermatitis due to poison ivy   [place] [facility] Kayla Jeoffrey RAMAN, FNP   1 year ago Acute pain of left shoulder   [place] [facility] Duanne Butler DASEN, MD   1 year ago Benign essential HTN   [place] [facility] Pickard, Butler DASEN, MD

## 2024-05-15 ENCOUNTER — Emergency Department (HOSPITAL_COMMUNITY)
Admission: EM | Admit: 2024-05-15 | Discharge: 2024-05-15 | Disposition: A | Discharge status: Home / Self Care | Payer: Worker's Compensation | Attending: Emergency Medicine | Admitting: Emergency Medicine

## 2024-05-15 ENCOUNTER — Other Ambulatory Visit: Payer: Self-pay

## 2024-05-15 ENCOUNTER — Emergency Department (HOSPITAL_COMMUNITY): Payer: Worker's Compensation

## 2024-05-15 ENCOUNTER — Encounter (HOSPITAL_COMMUNITY): Payer: Self-pay

## 2024-05-15 DIAGNOSIS — S0990XA Unspecified injury of head, initial encounter: Secondary | ICD-10-CM

## 2024-05-15 DIAGNOSIS — S060X1A Concussion with loss of consciousness of 30 minutes or less, initial encounter: Secondary | ICD-10-CM | POA: Insufficient documentation

## 2024-05-15 DIAGNOSIS — Z79899 Other long term (current) drug therapy: Secondary | ICD-10-CM | POA: Insufficient documentation

## 2024-05-15 DIAGNOSIS — Y990 Civilian activity done for income or pay: Secondary | ICD-10-CM | POA: Insufficient documentation

## 2024-05-15 DIAGNOSIS — W01198A Fall on same level from slipping, tripping and stumbling with subsequent striking against other object, initial encounter: Secondary | ICD-10-CM | POA: Diagnosis not present

## 2024-05-15 DIAGNOSIS — I10 Essential (primary) hypertension: Secondary | ICD-10-CM | POA: Insufficient documentation

## 2024-05-15 MED ORDER — ONDANSETRON 8 MG PO TBDP
8.0000 mg | ORAL_TABLET | Freq: Once | ORAL | Status: AC
  Administered 2024-05-15: 8 mg via ORAL
  Filled 2024-05-15: qty 1

## 2024-05-15 NOTE — ED Triage Notes (Signed)
 Pt POV presenting with fall- pt states he fell at work last night and hit head which made him lose consciousness. Pt states that he has had headache since had an episode of emesis last night. Pt states he then fell again today and landed on his back which made symptoms worse.

## 2024-05-15 NOTE — Discharge Instructions (Signed)
 Rest and drink plenty of fluids.  Tylenol  and ibuprofen  for pain.  Follow-up with your primary care doctor.  Return if any worsening or concerning symptoms

## 2024-05-15 NOTE — ED Provider Notes (Signed)
 " [place] EMERGENCY DEPARTMENT AT [facility] Provider Note   CSN: [id number] Arrival date & time: 05/15/24  1901     Patient presents with: Headache and Fall   Paul Lee is a 29 y.o. male.  He has a history of hypertension.  He says that he fell at work yesterday, hit the back of his head, passed out.  Was able to get himself up.  Vomited once or twice.  Took Advil .  Today had another slip and fall, did not hit head.  Made nauseous though and caused to vomit again.  Figured he should get checked out.  Moderate headache.  No blurry vision double vision.  No numbness or weakness.  No neck pain.  {Add pertinent medical, surgical, social history, OB history to YEP:67052} The history is provided by the patient.  Headache Pain location:  Generalized Quality:  Dull Severity currently:  4/10 Onset quality:  Sudden Duration:  2 days Timing:  Intermittent Progression:  Unchanged Chronicity:  New Ineffective treatments:  NSAIDs Associated symptoms: dizziness, nausea and vomiting   Associated symptoms: no blurred vision, no fever, no neck pain, no numbness and no visual change   Fall Associated symptoms include headaches.       Prior to Admission medications  Medication Sig Start Date End Date Taking? Authorizing Provider  escitalopram  (LEXAPRO ) 10 MG tablet Take 1 tablet (10 mg total) by mouth daily. 04/19/24   Duanne Cleveland Yarbro DASEN, MD  lisinopril  (ZESTRIL ) 10 MG tablet Take 1 tablet by mouth once daily 05/03/24   Duanne Yumi Insalaco DASEN, MD  loratadine  (CLARITIN ) 10 MG tablet Take 1 tablet (10 mg total) by mouth daily. 05/19/22   Duanne Culver Feighner DASEN, MD  omeprazole (PRILOSEC OTC) 20 MG tablet Take 20 mg by mouth daily.    [provider]    Allergies: Patient has no known allergies.    Review of Systems  Constitutional:  Negative for fever.  Eyes:  Negative for blurred vision.  Gastrointestinal:  Positive for nausea and vomiting.  Musculoskeletal:  Negative for neck  pain.  Neurological:  Positive for dizziness and headaches. Negative for numbness.    Updated Vital Signs BP (!) 140/72   Pulse 84   Temp 100.2 F (37.9 C) (Oral)   Resp 18   Wt 127 kg   SpO2 94%   BMI 37.97 kg/m   Physical Exam Vitals and nursing note reviewed.  Constitutional:      General: He is not in acute distress.    Appearance: He is well-developed.  HENT:     Head: Normocephalic and atraumatic.  Eyes:     Conjunctiva/sclera: Conjunctivae normal.  Cardiovascular:     Rate and Rhythm: Normal rate and regular rhythm.     Heart sounds: No murmur heard. Pulmonary:     Effort: Pulmonary effort is normal. No respiratory distress.     Breath sounds: Normal breath sounds.  Abdominal:     Palpations: Abdomen is soft.     Tenderness: There is no abdominal tenderness.  Musculoskeletal:        General: No swelling.     Cervical back: Neck supple.  Skin:    General: Skin is warm and dry.     Capillary Refill: Capillary refill takes less than 2 seconds.  Neurological:     Mental Status: He is alert.     GCS: GCS eye subscore is 4. GCS verbal subscore is 5. GCS motor subscore is 6.     Cranial Nerves:  No cranial nerve deficit.     Sensory: No sensory deficit.     Motor: No weakness.     (all labs ordered are listed, but only abnormal results are displayed) Labs Reviewed - No data to display  EKG: None  Radiology: No results found.  {Document cardiac monitor, telemetry assessment procedure when appropriate:32947} Procedures   Medications Ordered in the ED  ondansetron  (ZOFRAN -ODT) disintegrating tablet 8 mg (has no administration in time range)      {Click here for ABCD2, HEART and other calculators REFRESH Note before signing:1}                              Medical Decision Making Amount and/or Complexity of Data Reviewed Radiology: ordered.  Risk Prescription drug management.   This patient complains of ***; this involves an extensive number of  treatment Options and is a complaint that carries with it a high risk of complications and morbidity. The differential includes ***  I ordered, reviewed and interpreted labs, which included *** I ordered medication *** and reviewed PMP when indicated. I ordered imaging studies which included *** and I independently    visualized and interpreted imaging which showed *** Additional history obtained from *** Previous records obtained and reviewed *** I consulted *** and discussed lab and imaging findings and discussed disposition.  Cardiac monitoring reviewed, *** Social determinants considered, *** Critical Interventions: ***  After the interventions stated above, I reevaluated the patient and found *** Admission and further testing considered, ***   {Document critical care time when appropriate  Document review of labs and clinical decision tools ie CHADS2VASC2, etc  Document your independent review of radiology images and any outside records  Document your discussion with family members, caretakers and with consultants  Document social determinants of health affecting pt's care  Document your decision making why or why not admission, treatments were needed:32947:::1}   Final diagnoses:  None    ED Discharge Orders     None        "

## 2024-05-17 ENCOUNTER — Encounter: Payer: Self-pay | Admitting: Family Medicine

## 2024-05-17 ENCOUNTER — Ambulatory Visit: Payer: Self-pay | Admitting: Family Medicine

## 2024-05-17 VITALS — BP 125/70 | HR 76 | Temp 98.1°F | Ht 72.0 in | Wt 281.0 lb

## 2024-05-17 DIAGNOSIS — S060X1A Concussion with loss of consciousness of 30 minutes or less, initial encounter: Secondary | ICD-10-CM

## 2024-05-17 MED ORDER — PROMETHAZINE HCL 25 MG PO TABS: 25.0000 mg | ORAL_TABLET | Freq: Three times a day (TID) | ORAL | 0 refills | Status: AC | PRN
